# Patient Record
Sex: Female | Born: 1940 | Race: White | Hispanic: No | Smoking: Former smoker
Health system: Southern US, Community
[De-identification: ages and names within clinical notes are randomized; demographics above are authoritative.]

## PROBLEM LIST (undated history)

## (undated) DIAGNOSIS — Z950 Presence of cardiac pacemaker: Secondary | ICD-10-CM

## (undated) DIAGNOSIS — I442 Atrioventricular block, complete: Secondary | ICD-10-CM

## (undated) DIAGNOSIS — E119 Type 2 diabetes mellitus without complications: Secondary | ICD-10-CM

## (undated) DIAGNOSIS — N183 Chronic kidney disease, stage 3 unspecified: Secondary | ICD-10-CM

## (undated) DIAGNOSIS — I219 Acute myocardial infarction, unspecified: Secondary | ICD-10-CM

## (undated) DIAGNOSIS — M1990 Unspecified osteoarthritis, unspecified site: Secondary | ICD-10-CM

## (undated) DIAGNOSIS — E785 Hyperlipidemia, unspecified: Secondary | ICD-10-CM

## (undated) DIAGNOSIS — I509 Heart failure, unspecified: Secondary | ICD-10-CM

## (undated) DIAGNOSIS — F329 Major depressive disorder, single episode, unspecified: Secondary | ICD-10-CM

## (undated) DIAGNOSIS — I10 Essential (primary) hypertension: Secondary | ICD-10-CM

## (undated) DIAGNOSIS — F32A Depression, unspecified: Secondary | ICD-10-CM

## (undated) DIAGNOSIS — Z95810 Presence of automatic (implantable) cardiac defibrillator: Secondary | ICD-10-CM

## (undated) DIAGNOSIS — I428 Other cardiomyopathies: Secondary | ICD-10-CM

## (undated) DIAGNOSIS — I469 Cardiac arrest, cause unspecified: Secondary | ICD-10-CM

## (undated) DIAGNOSIS — R0602 Shortness of breath: Secondary | ICD-10-CM

---

## 1969-07-23 HISTORY — PX: TUBAL LIGATION: SHX77

## 2004-11-22 DIAGNOSIS — I469 Cardiac arrest, cause unspecified: Secondary | ICD-10-CM

## 2004-11-22 DIAGNOSIS — Z950 Presence of cardiac pacemaker: Secondary | ICD-10-CM

## 2004-11-22 DIAGNOSIS — Z95810 Presence of automatic (implantable) cardiac defibrillator: Secondary | ICD-10-CM

## 2004-11-22 DIAGNOSIS — I219 Acute myocardial infarction, unspecified: Secondary | ICD-10-CM

## 2004-11-22 HISTORY — PX: CARDIAC DEFIBRILLATOR PLACEMENT: SHX171

## 2004-11-22 HISTORY — PX: INSERT / REPLACE / REMOVE PACEMAKER: SUR710

## 2004-11-22 HISTORY — DX: Presence of cardiac pacemaker: Z95.0

## 2004-11-22 HISTORY — DX: Acute myocardial infarction, unspecified: I21.9

## 2004-11-22 HISTORY — DX: Presence of automatic (implantable) cardiac defibrillator: Z95.810

## 2004-11-22 HISTORY — DX: Cardiac arrest, cause unspecified: I46.9

## 2012-09-28 ENCOUNTER — Encounter (HOSPITAL_BASED_OUTPATIENT_CLINIC_OR_DEPARTMENT_OTHER): Payer: Self-pay | Admitting: *Deleted

## 2012-09-28 ENCOUNTER — Emergency Department (HOSPITAL_BASED_OUTPATIENT_CLINIC_OR_DEPARTMENT_OTHER): Payer: Medicare Other

## 2012-09-28 ENCOUNTER — Emergency Department (HOSPITAL_BASED_OUTPATIENT_CLINIC_OR_DEPARTMENT_OTHER)
Admission: EM | Admit: 2012-09-28 | Discharge: 2012-09-28 | Disposition: A | Discharge status: Home / Self Care | Payer: Medicare Other | Attending: Emergency Medicine | Admitting: Emergency Medicine

## 2012-09-28 DIAGNOSIS — Z794 Long term (current) use of insulin: Secondary | ICD-10-CM | POA: Insufficient documentation

## 2012-09-28 DIAGNOSIS — J4489 Other specified chronic obstructive pulmonary disease: Secondary | ICD-10-CM | POA: Insufficient documentation

## 2012-09-28 DIAGNOSIS — I509 Heart failure, unspecified: Secondary | ICD-10-CM

## 2012-09-28 DIAGNOSIS — Z79899 Other long term (current) drug therapy: Secondary | ICD-10-CM | POA: Insufficient documentation

## 2012-09-28 DIAGNOSIS — J449 Chronic obstructive pulmonary disease, unspecified: Secondary | ICD-10-CM | POA: Insufficient documentation

## 2012-09-28 DIAGNOSIS — E119 Type 2 diabetes mellitus without complications: Secondary | ICD-10-CM | POA: Insufficient documentation

## 2012-09-28 DIAGNOSIS — I10 Essential (primary) hypertension: Secondary | ICD-10-CM | POA: Insufficient documentation

## 2012-09-28 HISTORY — DX: Essential (primary) hypertension: I10

## 2012-09-28 LAB — COMPREHENSIVE METABOLIC PANEL
ALT: 27 U/L (ref 0–35)
Albumin: 3.1 g/dL — ABNORMAL LOW (ref 3.5–5.2)
Alkaline Phosphatase: 97 U/L (ref 39–117)
Chloride: 104 mEq/L (ref 96–112)
Glucose, Bld: 84 mg/dL (ref 70–99)
Potassium: 3.7 mEq/L (ref 3.5–5.1)
Sodium: 142 mEq/L (ref 135–145)
Total Bilirubin: 0.9 mg/dL (ref 0.3–1.2)
Total Protein: 6.8 g/dL (ref 6.0–8.3)

## 2012-09-28 LAB — URINALYSIS, ROUTINE W REFLEX MICROSCOPIC
Bilirubin Urine: NEGATIVE
Nitrite: NEGATIVE
Specific Gravity, Urine: 1.017 (ref 1.005–1.030)
Urobilinogen, UA: 1 mg/dL (ref 0.0–1.0)
pH: 5 (ref 5.0–8.0)

## 2012-09-28 LAB — CBC WITH DIFFERENTIAL/PLATELET
Basophils Relative: 1 % (ref 0–1)
Eosinophils Absolute: 0.5 10*3/uL (ref 0.0–0.7)
Hemoglobin: 13.1 g/dL (ref 12.0–15.0)
Lymphs Abs: 1.5 10*3/uL (ref 0.7–4.0)
MCH: 27 pg (ref 26.0–34.0)
Neutro Abs: 3.5 10*3/uL (ref 1.7–7.7)
Neutrophils Relative %: 59 % (ref 43–77)
Platelets: 281 10*3/uL (ref 150–400)
RBC: 4.86 MIL/uL (ref 3.87–5.11)

## 2012-09-28 LAB — URINE MICROSCOPIC-ADD ON

## 2012-09-28 NOTE — ED Notes (Signed)
Patient transported to X-ray 

## 2012-09-28 NOTE — ED Provider Notes (Addendum)
History     CSN: [id number]  Arrival date & time 09/28/12  1810   First MD Initiated Contact with Patient 09/28/12 2001      Chief Complaint  Patient presents with  . Weakness    (Consider location/radiation/quality/duration/timing/severity/associated sxs/prior treatment) HPI Comments: Is a 71 year old woman who moved to [place] in July to be near her daughter. She has a number of illnesses that she treats, including diabetes, hypertension, high blood pressure, and has a pacemaker. 4 weeks ago she had an episode where she became very nauseated nearly fainted. She was taken [facility] at that time and was felt to have had food poisoning. Since then she hasn't really recovered and seems very weak. She can walk out of the mailbox and back. Although, oral warm recent eye surgery, with laser surgery for diabetic retinopathy. She is scheduled for cataract surgery in a few weeks.  Patient is a 71 y.o. female presenting with weakness. The history is provided by the patient and a relative. No language interpreter was used.  Weakness  Additional symptoms include weakness.    Past Medical History  Diagnosis Date  . COPD (chronic obstructive pulmonary disease)   . Diabetes mellitus without complication   . Hypertension     Past Surgical History  Procedure Date  . Pacemaker insertion     No family history on file.  History  Substance Use Topics  . Smoking status: Never Smoker   . Smokeless tobacco: Not on file  . Alcohol Use: No    OB History    Grav Para Term Preterm Abortions TAB SAB Ect Mult Living                  Review of Systems  Neurological: Positive for weakness.    Allergies  Codeine  Home Medications   Current Outpatient Rx  Name  Route  Sig  Dispense  Refill  . AMITRIPTYLINE HCL 10 MG PO TABS   Oral   Take 10 mg by mouth at bedtime.         . ATORVASTATIN CALCIUM 20 MG PO TABS   Oral   Take 20 mg by mouth daily.           . FUROSEMIDE 40 MG PO TABS   Oral   Take 40 mg by mouth daily.         . INSULIN GLARGINE 100 UNIT/ML [place] SOLN   Subcutaneous   Inject 30 Units into the skin 2 (two) times daily.         Marland Kitchen METOPROLOL TARTRATE 25 MG PO TABS   Oral   Take by mouth 2 (two) times daily.           BP 167/82  Pulse 79  Temp 97.4 F (36.3 C) (Oral)  Resp 24  SpO2 97%  Physical Exam  Constitutional: She is oriented to person, place, and time. She appears well-developed and well-nourished. No distress.  HENT:  Head: Normocephalic and atraumatic.  Right Ear: External ear normal.  Left Ear: External ear normal.  Mouth/Throat: Oropharynx is clear and moist.  Eyes: Conjunctivae normal and EOM are normal. Pupils are equal, round, and reactive to light.  Neck: Normal range of motion. Neck supple.  Cardiovascular: Normal rate, regular rhythm and normal heart sounds.   Pulmonary/Chest: Effort normal and breath sounds normal.  Abdominal: Soft. Bowel sounds are normal.  Musculoskeletal:       Trace pedal edema bilaterally.  Neurological: She is  alert and oriented to person, place, and time.       No sensory or motor deficit.  Skin: Skin is warm and dry.    ED Course  Procedures (including critical care time)   Labs Reviewed  URINALYSIS, ROUTINE W REFLEX MICROSCOPIC  CBC WITH DIFFERENTIAL  COMPREHENSIVE METABOLIC PANEL  URINALYSIS, ROUTINE W REFLEX MICROSCOPIC  PRO B NATRIURETIC PEPTIDE    Date: 09/28/2012  Rate: 75  Rhythm:   Electronically paced rhythm  QRS Axis: left  Intervals: normal  ST/T Wave abnormalities: Inverted T waves in I, AVL  Conduction Disutrbances:left bundle branch block from pacing   Narrative Interpretation: Abnormal EKG  Old EKG Reviewed: none available  10:46 PM Results for orders placed during the hospital encounter of 09/28/12  URINALYSIS, ROUTINE W REFLEX MICROSCOPIC      Component Value Range   Color, Urine YELLOW  YELLOW   APPearance CLOUDY (*) CLEAR    Specific Gravity, Urine 1.017  1.005 - 1.030   pH 5.0  5.0 - 8.0   Glucose, UA NEGATIVE  NEGATIVE mg/dL   Hgb urine dipstick MODERATE (*) NEGATIVE   Bilirubin Urine NEGATIVE  NEGATIVE   Ketones, ur NEGATIVE  NEGATIVE mg/dL   Protein, ur >742 (*) NEGATIVE mg/dL   Urobilinogen, UA 1.0  0.0 - 1.0 mg/dL   Nitrite NEGATIVE  NEGATIVE   Leukocytes, UA SMALL (*) NEGATIVE  CBC WITH DIFFERENTIAL      Component Value Range   WBC 5.9  4.0 - 10.5 K/uL   RBC 4.86  3.87 - 5.11 MIL/uL   Hemoglobin 13.1  12.0 - 15.0 g/dL   HCT 59.5  63.8 - 75.6 %   MCV 81.1  78.0 - 100.0 fL   MCH 27.0  26.0 - 34.0 pg   MCHC 33.2  30.0 - 36.0 g/dL   RDW 43.3  29.5 - 18.8 %   Platelets 281  150 - 400 K/uL   Neutrophils Relative 59  43 - 77 %   Neutro Abs 3.5  1.7 - 7.7 K/uL   Lymphocytes Relative 25  12 - 46 %   Lymphs Abs 1.5  0.7 - 4.0 K/uL   Monocytes Relative 7  3 - 12 %   Monocytes Absolute 0.4  0.1 - 1.0 K/uL   Eosinophils Relative 8 (*) 0 - 5 %   Eosinophils Absolute 0.5  0.0 - 0.7 K/uL   Basophils Relative 1  0 - 1 %   Basophils Absolute 0.0  0.0 - 0.1 K/uL  COMPREHENSIVE METABOLIC PANEL      Component Value Range   Sodium 142  135 - 145 mEq/L   Potassium 3.7  3.5 - 5.1 mEq/L   Chloride 104  96 - 112 mEq/L   CO2 27  19 - 32 mEq/L   Glucose, Bld 84  70 - 99 mg/dL   BUN 28 (*) 6 - 23 mg/dL   Creatinine, Ser 4.16 (*) 0.50 - 1.10 mg/dL   Calcium 9.1  8.4 - 60.6 mg/dL   Total Protein 6.8  6.0 - 8.3 g/dL   Albumin 3.1 (*) 3.5 - 5.2 g/dL   AST 26  0 - 37 U/L   ALT 27  0 - 35 U/L   Alkaline Phosphatase 97  39 - 117 U/L   Total Bilirubin 0.9  0.3 - 1.2 mg/dL   GFR calc non Af Amer 40 (*) >90 mL/min   GFR calc Af Amer 46 (*) >90 mL/min  PRO B NATRIURETIC  PEPTIDE      Component Value Range   Pro B Natriuretic peptide (BNP) 6806.0 (*) 0 - 125 pg/mL  URINE MICROSCOPIC-ADD ON      Component Value Range   Squamous Epithelial / LPF FEW (*) RARE   WBC, UA 7-10  <3 WBC/hpf   RBC / HPF 7-10  <3 RBC/hpf    Bacteria, UA MANY (*) RARE   Dg Chest 2 View  09/28/2012  *RADIOLOGY REPORT*  Clinical Data: Generalized weakness.  Prior history of CHF. Current history of hypertension and diabetes.  CHEST - 2 VIEW  Comparison: None.  Findings: Cardiac silhouette mildly enlarged.  Left subclavian dual lead transvenous pacemaker with dual lead tips at the expected location of the right atrial appendage and right ventricular apex. Thoracic aorta atherosclerotic.  Hilar and mediastinal contours otherwise unremarkable.  Mild diffuse interstitial pulmonary edema. Small bilateral pleural effusions.  Mild degenerative changes involving the thoracic spine.  Old healed fracture involving the right humeral neck.  IMPRESSION: Mild CHF, with mild cardiomegaly and mild diffuse interstitial pulmonary edema.  Small bilateral pleural effusions.   Original Report Authenticated By: Hulan Saas, M.D.      Pt has mild CHF.  She has no local cardiologist.  Referred to Dr. Royann Shivers, cardiologist on call today.  She should continue her regular medications.   1. Congestive heart failure        Carleene Cooper III, MD 09/28/12 1610     Carleene Cooper III, MD 10/11/12 [phone]

## 2012-09-28 NOTE — ED Notes (Signed)
Weakness. States she was treated for food poisoning at [facility] ED 2 weeks ago. She states she also had a UTI. States she has not been the same since. She is very talkative. Multiple complaints and poor historian.

## 2012-09-28 NOTE — ED Notes (Signed)
Pt. Talks constantly and is uncooperative at times.  Pt. Very mouthy and will not go by her PCP recommendations.  Pt. Daughter is under impression she can get a new Dr. Cletis [place] the ED.  Pt. Reports she does not agree with what her PCP wants her to do.

## 2012-09-30 LAB — URINE CULTURE

## 2012-10-01 ENCOUNTER — Emergency Department (HOSPITAL_COMMUNITY): Payer: Medicare Other

## 2012-10-01 ENCOUNTER — Inpatient Hospital Stay (HOSPITAL_COMMUNITY)
Admission: EM | Admit: 2012-10-01 | Discharge: 2012-10-04 | DRG: 292 | Disposition: A | Discharge status: Home Health | Payer: Medicare Other | Attending: Internal Medicine | Admitting: Internal Medicine

## 2012-10-01 ENCOUNTER — Encounter (HOSPITAL_COMMUNITY): Payer: Self-pay | Admitting: Emergency Medicine

## 2012-10-01 DIAGNOSIS — Z95810 Presence of automatic (implantable) cardiac defibrillator: Secondary | ICD-10-CM

## 2012-10-01 DIAGNOSIS — I5023 Acute on chronic systolic (congestive) heart failure: Principal | ICD-10-CM | POA: Diagnosis present

## 2012-10-01 DIAGNOSIS — B9689 Other specified bacterial agents as the cause of diseases classified elsewhere: Secondary | ICD-10-CM | POA: Diagnosis present

## 2012-10-01 DIAGNOSIS — I129 Hypertensive chronic kidney disease with stage 1 through stage 4 chronic kidney disease, or unspecified chronic kidney disease: Secondary | ICD-10-CM | POA: Diagnosis present

## 2012-10-01 DIAGNOSIS — N179 Acute kidney failure, unspecified: Secondary | ICD-10-CM | POA: Diagnosis present

## 2012-10-01 DIAGNOSIS — Z885 Allergy status to narcotic agent status: Secondary | ICD-10-CM

## 2012-10-01 DIAGNOSIS — I509 Heart failure, unspecified: Secondary | ICD-10-CM | POA: Diagnosis present

## 2012-10-01 DIAGNOSIS — J4489 Other specified chronic obstructive pulmonary disease: Secondary | ICD-10-CM | POA: Diagnosis present

## 2012-10-01 DIAGNOSIS — I442 Atrioventricular block, complete: Secondary | ICD-10-CM | POA: Diagnosis present

## 2012-10-01 DIAGNOSIS — Z79899 Other long term (current) drug therapy: Secondary | ICD-10-CM

## 2012-10-01 DIAGNOSIS — E119 Type 2 diabetes mellitus without complications: Secondary | ICD-10-CM | POA: Diagnosis present

## 2012-10-01 DIAGNOSIS — E876 Hypokalemia: Secondary | ICD-10-CM | POA: Diagnosis present

## 2012-10-01 DIAGNOSIS — I10 Essential (primary) hypertension: Secondary | ICD-10-CM | POA: Diagnosis present

## 2012-10-01 DIAGNOSIS — N289 Disorder of kidney and ureter, unspecified: Secondary | ICD-10-CM | POA: Diagnosis not present

## 2012-10-01 DIAGNOSIS — I428 Other cardiomyopathies: Secondary | ICD-10-CM | POA: Diagnosis present

## 2012-10-01 DIAGNOSIS — I429 Cardiomyopathy, unspecified: Secondary | ICD-10-CM | POA: Diagnosis present

## 2012-10-01 DIAGNOSIS — N390 Urinary tract infection, site not specified: Secondary | ICD-10-CM | POA: Diagnosis present

## 2012-10-01 DIAGNOSIS — Z8674 Personal history of sudden cardiac arrest: Secondary | ICD-10-CM

## 2012-10-01 DIAGNOSIS — N183 Chronic kidney disease, stage 3 unspecified: Secondary | ICD-10-CM | POA: Diagnosis present

## 2012-10-01 DIAGNOSIS — E785 Hyperlipidemia, unspecified: Secondary | ICD-10-CM | POA: Diagnosis present

## 2012-10-01 DIAGNOSIS — Z794 Long term (current) use of insulin: Secondary | ICD-10-CM

## 2012-10-01 DIAGNOSIS — J449 Chronic obstructive pulmonary disease, unspecified: Secondary | ICD-10-CM | POA: Diagnosis present

## 2012-10-01 DIAGNOSIS — Z87891 Personal history of nicotine dependence: Secondary | ICD-10-CM

## 2012-10-01 HISTORY — DX: Cardiac arrest, cause unspecified: I46.9

## 2012-10-01 HISTORY — DX: Hyperlipidemia, unspecified: E78.5

## 2012-10-01 HISTORY — DX: Chronic kidney disease, stage 3 unspecified: N18.30

## 2012-10-01 HISTORY — DX: Heart failure, unspecified: I50.9

## 2012-10-01 HISTORY — DX: Chronic kidney disease, stage 3 (moderate): N18.3

## 2012-10-01 LAB — CBC
HCT: 41.4 % (ref 36.0–46.0)
Hemoglobin: 13.9 g/dL (ref 12.0–15.0)
MCH: 27.5 pg (ref 26.0–34.0)
MCHC: 33.6 g/dL (ref 30.0–36.0)
MCV: 82 fL (ref 78.0–100.0)
MCV: 82.3 fL (ref 78.0–100.0)
Platelets: 264 10*3/uL (ref 150–400)
RBC: 5.21 MIL/uL — ABNORMAL HIGH (ref 3.87–5.11)
WBC: 6.6 10*3/uL (ref 4.0–10.5)

## 2012-10-01 LAB — HEMOGLOBIN A1C
Hgb A1c MFr Bld: 9.2 % — ABNORMAL HIGH (ref <5.7)
Mean Plasma Glucose: 217 mg/dL — ABNORMAL HIGH (ref <117)

## 2012-10-01 LAB — CREATININE, SERUM
Creatinine, Ser: 1.16 mg/dL — ABNORMAL HIGH (ref 0.50–1.10)
GFR calc Af Amer: 53 mL/min — ABNORMAL LOW (ref >90)

## 2012-10-01 LAB — LIPID PANEL
HDL: 31 mg/dL — ABNORMAL LOW (ref >39)
LDL Cholesterol: 70 mg/dL (ref 0–99)
Total CHOL/HDL Ratio: 3.7 RATIO

## 2012-10-01 LAB — BASIC METABOLIC PANEL
BUN: 30 mg/dL — ABNORMAL HIGH (ref 6–23)
Calcium: 8.8 mg/dL (ref 8.4–10.5)
Creatinine, Ser: 1.11 mg/dL — ABNORMAL HIGH (ref 0.50–1.10)
GFR calc non Af Amer: 48 mL/min — ABNORMAL LOW (ref >90)
Glucose, Bld: 218 mg/dL — ABNORMAL HIGH (ref 70–99)
Potassium: 3.7 mEq/L (ref 3.5–5.1)

## 2012-10-01 LAB — TROPONIN I: Troponin I: <0.3 ng/mL (ref <0.30)

## 2012-10-01 LAB — CK TOTAL AND CKMB (NOT AT ARMC): Relative Index: 4.4 — ABNORMAL HIGH (ref 0.0–2.5)

## 2012-10-01 MED ORDER — ATORVASTATIN CALCIUM 20 MG PO TABS
20.0000 mg | ORAL_TABLET | Freq: Every day | ORAL | Status: DC
  Administered 2012-10-01 – 2012-10-03 (×3): 20 mg via ORAL
  Filled 2012-10-01 (×4): qty 1

## 2012-10-01 MED ORDER — SODIUM CHLORIDE 0.9 % IJ SOLN
3.0000 mL | Freq: Two times a day (BID) | INTRAMUSCULAR | Status: DC
  Administered 2012-10-01 – 2012-10-03 (×4): 3 mL via INTRAVENOUS

## 2012-10-01 MED ORDER — INSULIN GLARGINE 100 UNIT/ML ~~LOC~~ SOLN
30.0000 [IU] | Freq: Two times a day (BID) | SUBCUTANEOUS | Status: DC
  Administered 2012-10-01 – 2012-10-04 (×6): 30 [IU] via SUBCUTANEOUS

## 2012-10-01 MED ORDER — ACETAMINOPHEN 325 MG PO TABS: 650.0000 mg | ORAL_TABLET | ORAL | Status: DC | PRN

## 2012-10-01 MED ORDER — SODIUM CHLORIDE 0.9 % IJ SOLN: 3.0000 mL | INTRAMUSCULAR | Status: DC | PRN

## 2012-10-01 MED ORDER — SODIUM CHLORIDE 0.9 % IV SOLN: 250.0000 mL | INTRAVENOUS | Status: DC | PRN

## 2012-10-01 MED ORDER — AMITRIPTYLINE HCL 10 MG PO TABS
10.0000 mg | ORAL_TABLET | Freq: Every day | ORAL | Status: DC
  Administered 2012-10-01 – 2012-10-03 (×3): 10 mg via ORAL
  Filled 2012-10-01 (×4): qty 1

## 2012-10-01 MED ORDER — DEXTROSE 5 % IV SOLN
1.0000 g | Freq: Once | INTRAVENOUS | Status: AC
  Administered 2012-10-01: 1 g via INTRAVENOUS
  Filled 2012-10-01: qty 10

## 2012-10-01 MED ORDER — ASPIRIN EC 81 MG PO TBEC
81.0000 mg | DELAYED_RELEASE_TABLET | Freq: Every day | ORAL | Status: DC
  Administered 2012-10-01 – 2012-10-04 (×4): 81 mg via ORAL
  Filled 2012-10-01 (×4): qty 1

## 2012-10-01 MED ORDER — ONDANSETRON HCL 4 MG/2ML IJ SOLN
4.0000 mg | Freq: Four times a day (QID) | INTRAMUSCULAR | Status: DC | PRN
  Administered 2012-10-03: 4 mg via INTRAVENOUS
  Filled 2012-10-01: qty 2

## 2012-10-01 MED ORDER — FUROSEMIDE 40 MG PO TABS
40.0000 mg | ORAL_TABLET | Freq: Every day | ORAL | Status: DC
  Filled 2012-10-01: qty 1

## 2012-10-01 MED ORDER — FUROSEMIDE 10 MG/ML IJ SOLN
80.0000 mg | Freq: Once | INTRAMUSCULAR | Status: AC
  Administered 2012-10-01: 80 mg via INTRAVENOUS
  Filled 2012-10-01: qty 8

## 2012-10-01 MED ORDER — INSULIN ASPART 100 UNIT/ML ~~LOC~~ SOLN
0.0000 [IU] | Freq: Three times a day (TID) | SUBCUTANEOUS | Status: DC
  Administered 2012-10-02 – 2012-10-03 (×2): 2 [IU] via SUBCUTANEOUS

## 2012-10-01 MED ORDER — ENOXAPARIN SODIUM 40 MG/0.4ML ~~LOC~~ SOLN
40.0000 mg | SUBCUTANEOUS | Status: DC
  Administered 2012-10-01 – 2012-10-03 (×3): 40 mg via SUBCUTANEOUS
  Filled 2012-10-01 (×4): qty 0.4

## 2012-10-01 MED ORDER — INSULIN ASPART 100 UNIT/ML ~~LOC~~ SOLN
0.0000 [IU] | Freq: Every day | SUBCUTANEOUS | Status: DC
  Administered 2012-10-01: 2 [IU] via SUBCUTANEOUS

## 2012-10-01 MED ORDER — METOPROLOL TARTRATE 12.5 MG HALF TABLET
12.5000 mg | ORAL_TABLET | Freq: Two times a day (BID) | ORAL | Status: DC
  Administered 2012-10-01 – 2012-10-04 (×6): 12.5 mg via ORAL
  Filled 2012-10-01 (×7): qty 1

## 2012-10-01 MED ORDER — LISINOPRIL 10 MG PO TABS
10.0000 mg | ORAL_TABLET | Freq: Every day | ORAL | Status: DC
  Administered 2012-10-01 – 2012-10-03 (×3): 10 mg via ORAL
  Filled 2012-10-01 (×4): qty 1

## 2012-10-01 NOTE — ED Notes (Signed)
Pt c.o weakness and shortness of breath x 2 days. Pt seen at PMD the other day. Pt reports symptoms similar to when she has fluid build up.

## 2012-10-01 NOTE — ED Notes (Signed)
+   Urine Chart sent to EDP office for review. 

## 2012-10-01 NOTE — ED Provider Notes (Signed)
History     CSN: [id number]  Arrival date & time 10/01/12  1000   First MD Initiated Contact with Patient 10/01/12 1011      Chief Complaint  Patient presents with  . Weakness  . Shortness of Breath    The history is provided by the patient.  worsening SOB over the past 5-7 days. No CP. Exertional SOB. Compliant with meds including lasix. Hx of CHF. From [place], still taking all of her medications. Hx of pacer. Denies unilateral leg swelling. Seen at outside ER several days ago, outpatient cards appt made for 4 days from now. No change in meds at that time in the ER. Symptoms worse with walking and lying flat. Symptoms moderate in severity. No fever or chills or cough  Past Medical History  Diagnosis Date  . COPD (chronic obstructive pulmonary disease)   . Diabetes mellitus without complication   . Hypertension   . UTI (lower urinary tract infection)   . CHF (congestive heart failure)     Past Surgical History  Procedure Date  . Pacemaker insertion     No family history on file.  History  Substance Use Topics  . Smoking status: Never Smoker   . Smokeless tobacco: Not on file  . Alcohol Use: No    OB History    Grav Para Term Preterm Abortions TAB SAB Ect Mult Living                  Review of Systems  All other systems reviewed and are negative.    Allergies  Codeine  Home Medications   Current Outpatient Rx  Name  Route  Sig  Dispense  Refill  . AMITRIPTYLINE HCL 10 MG PO TABS   Oral   Take 10 mg by mouth at bedtime.         . ATORVASTATIN CALCIUM 20 MG PO TABS   Oral   Take 20 mg by mouth daily.         . FUROSEMIDE 40 MG PO TABS   Oral   Take 40 mg by mouth daily.         . INSULIN GLARGINE 100 UNIT/ML [place] SOLN   Subcutaneous   Inject 30 Units into the skin 2 (two) times daily.         Marland Kitchen METOPROLOL TARTRATE 25 MG PO TABS   Oral   Take 12.5 mg by mouth 2 (two) times daily.            BP 169/105  Pulse 93  Temp 98.3 F (36.8  C) (Oral)  Resp 22  Physical Exam  Nursing note and vitals reviewed. Constitutional: She is oriented to person, place, and time. She appears well-developed and well-nourished. No distress.  HENT:  Head: Normocephalic and atraumatic.  Eyes: EOM are normal.  Neck: Normal range of motion.  Cardiovascular: Normal rate, regular rhythm and normal heart sounds.   Pulmonary/Chest: Effort normal and breath sounds normal.       Rales in bases  Abdominal: Soft. She exhibits no distension. There is no tenderness.  Musculoskeletal: Normal range of motion.  Neurological: She is alert and oriented to person, place, and time.  Skin: Skin is warm and dry.  Psychiatric: She has a normal mood and affect. Judgment normal.    ED Course  Procedures (including critical care time)  Labs Reviewed  BASIC METABOLIC PANEL - Abnormal; Notable for the following:    Glucose, Bld 218 (*)  BUN 30 (*)     Creatinine, Ser 1.11 (*)     GFR calc non Af Amer 48 (*)     GFR calc Af Amer 56 (*)     All other components within normal limits  PRO B NATRIURETIC PEPTIDE - Abnormal; Notable for the following:    Pro B Natriuretic peptide (BNP) 6579.0 (*)     All other components within normal limits  CBC  TROPONIN I   Dg Chest 2 View  10/01/2012  *RADIOLOGY REPORT*  Clinical Data: Weakness, shortness of breath  CHEST - 2 VIEW  Comparison: 09/28/2012  Findings: Cardiomegaly again noted.  Dual lead cardiac pacemaker is unchanged in position.  Mild perihilar and infrahilar interstitial prominence.  Mild interstitial edema cannot be excluded.  Small bilateral pleural effusion with basilar atelectasis.  IMPRESSION: Dual lead cardiac pacemaker is unchanged in position.  Mild perihilar and infrahilar interstitial prominence.  Mild interstitial edema cannot be excluded.  Small bilateral pleural effusion with basilar atelectasis.   Original Report Authenticated By: Natasha Mead, M.D.    I personally reviewed the imaging tests  through PACS system I reviewed available ER/hospitalization records through the EMR   1. CHF (congestive heart failure)     Date: 10/01/2012  Rate: 94  Rhythm: normal sinus rhythm  QRS Axis: normal  Intervals: LBBB  ST/T Wave abnormalities: normal  Conduction Disutrbances: none  Narrative Interpretation:   Old EKG Reviewed: No significant changes noted      MDM  Will admit for CHF exacerbation. OPC to admit. No CP.         Lyanne Co, MD 10/01/12 [phone]

## 2012-10-01 NOTE — ED Notes (Signed)
PT absent from room at this time . Family reported PT went to X-RAY.

## 2012-10-01 NOTE — H&P (Signed)
Hospital Admission Note Date: 10/01/2012  Patient name: [name] [name] Medical record number: [id number] Date of birth: 05-Jan-1941 Age: 71 y.o. Gender: female PCP: [name] [name]  Medical Service: [facility]  Attending physician:  [name] [name]  1st Contact: [name] [name]   Pager: [phone] 2nd Contact: [name] [name]   Pager: [phone] After 5 pm or weekends: 1st Contact:  Intern on call   Pager: [phone] 2nd Contact:  Resident on call  Pager: [phone]  Chief Complaint: Worsening shortness of breath  History of Present Illness: [name] [name] is a 71 year old female with past medical history of heart failure with unknown baseline ejection fraction, cardiac arrest s/p dual chamber pacemaker (2006), hypertension, type 2 diabetes, and hyperlipidemia presenting with subacute worsening of shortness of breath. Patient reports that over the past month she is noticed increasing shortness of breath with exertion, fatigue, and worsening of her bilateral lower extremity edema. She says that these symptoms have become more pronounced over the past 5-7 days.  She cannot walk to the mailbox and back without becoming short of breath. She also notices increasing shortness of breath when she has to carry on conversations. She has mild SOB at rest. She sleeps on 2 pillows at night and awoke last night with orthopnea. She reports compliance with home lasix dose of 40mg  daily. She denies chest pain/pressure or palpitations. She says she has a chronic cough over the past month which is sometimes productive of green/yellow sputum. On 11/7, she presented to the [facility] ED with similar complaints. She was scheduled follow-up with [name] [name] (cardiology) for 10/05/12 and discharged from the ED. She says that her symptoms have not improved which is why she is presenting today.  She denies fever, chills, weight change, HA, acute visual changes, abdominal pain, nausea, vomiting, diarrhea, constipation, hematochezia,  melena, dysuria, urinary frequency, change in color of urine, dizziness, weakness. She has chronic "burning" of bilateral feet for which she takes amitriptyline.   Meds: Current Outpatient Rx  Name  Route  Sig  Dispense  Refill  . AMITRIPTYLINE HCL 10 MG PO TABS   Oral   Take 10 mg by mouth at bedtime.         . ATORVASTATIN CALCIUM 20 MG PO TABS   Oral   Take 20 mg by mouth daily.         . FUROSEMIDE 40 MG PO TABS   Oral   Take 40 mg by mouth daily.         . INSULIN GLARGINE 100 UNIT/ML [place] SOLN   Subcutaneous   Inject 30 Units into the skin 2 (two) times daily.         Marland Kitchen METOPROLOL TARTRATE 25 MG PO TABS   Oral   Take 12.5 mg by mouth 2 (two) times daily.            Allergies: Allergies as of 10/01/2012 - Review Complete 10/01/2012  Allergen Reaction Noted  . Codeine Nausea Only 09/28/2012   Past Medical History  Diagnosis Date  . Diabetes mellitus   . Hypertension   . CHF (congestive heart failure)   . Cardiac arrest     2006, etiology unknown, s/p dual chamber pacemaker for secondary prevention  . Hyperlipidemia   . Chronic renal insufficiency, stage III (moderate)     Etiology unknown   Past Surgical History  Procedure Date  . Pacemaker insertion     Dual chamber pacemaker, 2006, for secondary prevention cardiac arrest  . Tubal ligation  Family History  Problem Relation Age of Onset  . Heart disease Brother   . Diabetes Mother   . Diabetes Father    History   Social History  . Marital Status: Widowed    Spouse Name: N/A    Number of Children: N/A  . Years of Education: N/A   Occupational History  . Not on file.   Social History Main Topics  . Smoking status: Former Smoker -- 1.0 packs/day for 3 years    Types: Cigarettes  . Smokeless tobacco: Not on file  . Alcohol Use: No     Comment: No current, former heavy use, years since last drink  . Drug Use: No  . Sexually Active: Not Currently   Other Topics Concern  . Not on file    Social History Narrative   Pt is widowed x2. 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 Moved here in July 2013 to be closer to daughter. One son died of MI earlier 2012-05-09 and another was murdered later this summer. 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 3 pack year smoking history. Lives in [place] on her own, near daughter.     Review of Systems: 10 pt ROS performed, pertinent positives and negatives noted in HPI  Physical Exam: Blood pressure 144/112, pulse 90, temperature 98.3 F (36.8 C), temperature source Oral, resp. rate 22, SpO2 100.00%. Vitals reviewed. General: resting in bed, breathing slightly labored, NAD HEENT: PERRL, EOMI, MMM of OP, absent dentition, caries Cardiac: RRR, no rubs, murmurs or gallops Pulm: fine crackles in bilateral bases, good air movement, no wheezing or rhonchi, no accessory muscle use Abd: soft, nontender, nondistended, BS present Ext: 2+ pitting edema up to knees bilaterally, non-blanchable reddish-brown pigmented lesions over dorsal aspect bilateral feet; warm and well perfused, 2+ DP pulses Neuro: alert and oriented X3, cranial nerves II-XII grossly intact, strength and sensation to light touch equal in bilateral upper and lower extremities   Lab results: Basic Metabolic Panel:  Basename 10/01/12 1041 09/28/12 05/09/02  NA 141 142  K 3.7 3.7  CL 105 104  CO2 26 27  GLUCOSE 218* 84  BUN 30* 28*  CREATININE 1.11* 1.30*  CALCIUM 8.8 9.1  MG -- --  PHOS -- --   Liver Function Tests:  [employer] [facility] 09/28/12 May 09, 2102  AST 26  ALT 27  ALKPHOS 97  BILITOT 0.9  PROT 6.8  ALBUMIN 3.1*   CBC:  Basename 10/01/12 1041 09/28/12 2103  WBC 7.1 5.9  NEUTROABS -- 3.5  HGB 13.9 13.1  HCT 41.4 39.4  MCV 82.0 81.1  PLT 256 281   Cardiac Enzymes:  Basename 10/01/12 1351 10/01/12 1042  CKTOTAL 117 --  CKMB 5.2* --  CKMBINDEX -- --  TROPONINI -- <0.30   BNP:  Basename 10/01/12 1042 09/28/12 05-09-2102  PROBNP 6579.0* 6806.0*    Urinalysis:  Basename 09/28/12 05/09/41  COLORURINE YELLOW  LABSPEC 1.017  PHURINE 5.0  GLUCOSEU NEGATIVE  HGBUR MODERATE*  BILIRUBINUR NEGATIVE  KETONESUR NEGATIVE  PROTEINUR >300*  UROBILINOGEN 1.0  NITRITE NEGATIVE  LEUKOCYTESUR SMALL*   Imaging results:  Dg Chest 2 View  10/01/2012  *RADIOLOGY REPORT*  Clinical Data: Weakness, shortness of breath  CHEST - 2 VIEW  Comparison: 09/28/2012  Findings: Cardiomegaly again noted.  Dual lead cardiac pacemaker is unchanged in position.  Mild perihilar and infrahilar interstitial prominence.  Mild interstitial edema cannot be excluded.  Small bilateral pleural effusion with basilar atelectasis.  IMPRESSION: Dual lead cardiac pacemaker is unchanged  in position.  Mild perihilar and infrahilar interstitial prominence.  Mild interstitial edema cannot be excluded.  Small bilateral pleural effusion with basilar atelectasis.   Original Report Authenticated By: Natasha Mead, M.D.     Other results: EKG: appears paced (atrial sense,ventricular paced) but pacing hatch marks are subtle an some buried in baseline. Prolonged QT. No new changes compared to 09/28/12.  Assessment & Plan by Problem: Ms. Quaid is a 71 yo female w pmh of CHF, COPD, T2DM and HTN presenting with subacute worsening of SOB and LE edema concerning for exacerbation of CHF.  1) Acute on chronic CHF exacerbation  Pt presents with gradual worsening of SOB, LE edema in setting of heart failure. Patient cannot remember many details about diagnosis, but says she takes 40mg  furosemide daily. Does not know baseline EF. CXR demonstrates mild interstitial edema and small b/l pleural effusions. Her pro-BNP is 6579, slightly less than the value 3 days ago (6806) when she presented to the ED w similar symptoms. Reassuringly, no acute EKG changes, troponin negative x1.  She received 80mg  IV lasix in the ED. Has appt w [name] [name] for this Thursday.  -  Will restart home lasix tomorrow, evaluate need  for continued IV lasix - echo - daily wts, I/Os - cycle CEs  2)  H/o cardiac arrest s/p dual chamber pacemaker  She has history of cardiac arrest in 2006 etiology unknown s/p dual chamber pacemaker. No chest pain or palpitations, has not noticed any strange rhythms of her heart. By EKG it looks like she continues to be atrial sensing, ventricular pacing. - repeat EKG tomorrow am, - cards follow up on Thursday as above  3) HTN Patient's BP is elevated, systolics 140s-160s diastolics 100s-110s. Takes metoprolol 12.5 BID at home, not on ACE inhibitor. - Continue metoprolol 12.5 BID, start lisinopril 10 daily  4) T2DM On lantus 30U BID at home. Takes blood glucose erratically, cannot remember values at home. Glucose on Bmet 218. - Will check HbA1c - SSI while inpt  5) Likely chronic renal failure, stage 3 Pt's GFR is 48. This is improved from her visit 3 days ago, when GFR was 40. She says she has been told that she needs to be "careful"  with her kidneys, but does not know of disease or diagnosis. Could be related to diabetic disease.  6) Possible obstructive pulmonary disease Pt reports dx COPD but no PFTs in the past, not currently on inhaler therapy, no lung hyperinflation/emphesematous changes on radiography, and no wheezing. She has only smoked for a few years.  - She may need PFTs as an outpatient at some point if she develops sx of COPD   Signed: Bronson Curb 10/01/2012, 2:45 PM

## 2012-10-01 NOTE — ED Notes (Signed)
Move to CDU 11

## 2012-10-02 DIAGNOSIS — E119 Type 2 diabetes mellitus without complications: Secondary | ICD-10-CM

## 2012-10-02 DIAGNOSIS — I509 Heart failure, unspecified: Secondary | ICD-10-CM

## 2012-10-02 DIAGNOSIS — I10 Essential (primary) hypertension: Secondary | ICD-10-CM

## 2012-10-02 LAB — URINALYSIS, ROUTINE W REFLEX MICROSCOPIC
Leukocytes, UA: NEGATIVE
Nitrite: NEGATIVE
Specific Gravity, Urine: 1.006 (ref 1.005–1.030)
pH: 7 (ref 5.0–8.0)

## 2012-10-02 LAB — BASIC METABOLIC PANEL
CO2: 26 mEq/L (ref 19–32)
Calcium: 8.5 mg/dL (ref 8.4–10.5)
Sodium: 137 mEq/L (ref 135–145)

## 2012-10-02 LAB — URINE MICROSCOPIC-ADD ON

## 2012-10-02 LAB — GLUCOSE, CAPILLARY
Glucose-Capillary: 63 mg/dL — ABNORMAL LOW (ref 70–99)
Glucose-Capillary: 107 mg/dL — ABNORMAL HIGH (ref 70–99)

## 2012-10-02 MED ORDER — SULFAMETHOXAZOLE-TMP DS 800-160 MG PO TABS
1.0000 | ORAL_TABLET | Freq: Two times a day (BID) | ORAL | Status: DC
  Administered 2012-10-02 – 2012-10-03 (×4): 1 via ORAL
  Filled 2012-10-02 (×7): qty 1

## 2012-10-02 MED ORDER — POTASSIUM CHLORIDE CRYS ER 20 MEQ PO TBCR
40.0000 meq | EXTENDED_RELEASE_TABLET | Freq: Two times a day (BID) | ORAL | Status: AC
  Administered 2012-10-02 (×2): 40 meq via ORAL
  Filled 2012-10-02 (×2): qty 2

## 2012-10-02 MED ORDER — FUROSEMIDE 10 MG/ML IJ SOLN: 40.0000 mg | Freq: Once | INTRAMUSCULAR | Status: DC

## 2012-10-02 MED ORDER — FUROSEMIDE 10 MG/ML IJ SOLN
80.0000 mg | Freq: Once | INTRAMUSCULAR | Status: AC
  Administered 2012-10-02: 80 mg via INTRAVENOUS

## 2012-10-02 NOTE — Progress Notes (Signed)
Subjective: Pt says LE swelling is worsened this morning. No change in shortness of breath. All Is/Os not recorded last night, but pt wt down 2lbs s/p 80mg  IV lasix in ED. Concerned because she wants to make sure she is out of the hospital to make it to her appointment on Thursday with Dr. Keene Breath.  Denies CP, N/V, dizziness, abdominal pain, diarrhea.  Objective: Vital signs in last 24 hours: Filed Vitals:   10/01/12 1834 10/01/12 2100 10/02/12 0409 10/02/12 1028  BP: 171/91 153/82 117/58 120/60  Pulse: 89 84 76 89  Temp: 97.6 F (36.4 C) 98.3 F (36.8 C) 98.1 F (36.7 C)   TempSrc: Oral Oral Oral   Resp: 22 22 18    Height: 5\' 3"  (1.6 m)     Weight: 153 lb 11.2 oz (69.718 kg)  151 lb 12.8 oz (68.856 kg)   SpO2: 96% 98% 97%    Weight change:   Intake/Output Summary (Last 24 hours) at 10/02/12 1059 Last data filed at 10/02/12 0921  Gross per 24 hour  Intake    480 ml  Output    750 ml  Net   -270 ml  General: sitting up at the side of the bed to eat breakfast, NAD  HEENT: PERRL, EOMI, MMM of OP, absent dentition, caries  Cardiac: RRR, no rubs, murmurs or gallops  Pulm: fine crackles in bilateral bases, good air movement, no wheezing or rhonchi, no accessory muscle use  Abd: soft, nontender, nondistended, BS present  Ext: 2+ pitting edema up to knees bilaterally slightly worse than yesterday, non-blanchable reddish-brown pigmented lesions over dorsal aspect bilateral feet; warm and well perfused, 2+ DP pulses  Neuro: alert and oriented X3, cranial nerves II-XII grossly intact, strength and sensation to light touch equal in bilateral upper and lower extremities  Lab Results: Basic Metabolic Panel:  Lab 10/02/12 5409 10/01/12 1847 10/01/12 1041  NA 137 -- 141  K 3.2* -- 3.7  CL 102 -- 105  CO2 26 -- 26  GLUCOSE 145* -- 218*  BUN 25* -- 30*  CREATININE 1.13* 1.16* --  CALCIUM 8.5 -- 8.8  MG -- -- --  PHOS -- -- --   Liver Function Tests:  Lab 09/28/12 2103  AST 26    ALT 27  ALKPHOS 97  BILITOT 0.9  PROT 6.8  ALBUMIN 3.1*   CBC:  Lab 10/01/12 1847 10/01/12 1041 09/28/12 2103  WBC 6.6 7.1 --  NEUTROABS -- -- 3.5  HGB 13.9 13.9 --  HCT 42.9 41.4 --  MCV 82.3 82.0 --  PLT 264 256 --   Cardiac Enzymes:  Lab 10/02/12 0150 10/01/12 2022 10/01/12 1351 10/01/12 1042  CKTOTAL -- -- 117 --  CKMB -- -- 5.2* --  CKMBINDEX -- -- -- --  TROPONINI <0.30 <0.30 -- <0.30   BNP:  Lab 10/01/12 1042 09/28/12 2103  PROBNP 6579.0* 6806.0*   CBG:  Lab 10/02/12 0654 10/02/12 0621 10/02/12 0554 10/01/12 2120  GLUCAP 97 63* 69* 221*   Hemoglobin A1C:  Lab 10/01/12 1351  HGBA1C 9.2*   Fasting Lipid Panel:  Lab 10/01/12 1355  CHOL 114  HDL 31*  LDLCALC 70  TRIG 66  CHOLHDL 3.7  LDLDIRECT --   Urinalysis:  Lab 09/28/12 2042  COLORURINE YELLOW  LABSPEC 1.017  PHURINE 5.0  GLUCOSEU NEGATIVE  HGBUR MODERATE*  BILIRUBINUR NEGATIVE  KETONESUR NEGATIVE  PROTEINUR >300*  UROBILINOGEN 1.0  NITRITE NEGATIVE  LEUKOCYTESUR SMALL*   Micro Results: Recent Results (from the past  240 hour(s))  URINE CULTURE     Status: Normal   Collection Time   09/28/12  8:42 PM      Component Value Range Status Comment   Specimen Description URINE, CLEAN CATCH   Final    Special Requests NONE   Final    Culture  Setup Time 09/29/2012 04:50   Final    Colony Count >=100,000 COLONIES/ML   Final    Culture ENTEROBACTER CLOACAE   Final    Report Status 09/30/2012 FINAL   Final    Organism ID, Bacteria ENTEROBACTER CLOACAE   Final    Studies/Results: Dg Chest 2 View  10/01/2012  *RADIOLOGY REPORT*  Clinical Data: Weakness, shortness of breath  CHEST - 2 VIEW  Comparison: 09/28/2012  Findings: Cardiomegaly again noted.  Dual lead cardiac pacemaker is unchanged in position.  Mild perihilar and infrahilar interstitial prominence.  Mild interstitial edema cannot be excluded.  Small bilateral pleural effusion with basilar atelectasis.  IMPRESSION: Dual lead cardiac  pacemaker is unchanged in position.  Mild perihilar and infrahilar interstitial prominence.  Mild interstitial edema cannot be excluded.  Small bilateral pleural effusion with basilar atelectasis.   Original Report Authenticated By: Natasha Mead, M.D.    Medications: I have reviewed the patient's current medications. Scheduled Meds:   . amitriptyline  10 mg Oral QHS  . aspirin EC  81 mg Oral Daily  . atorvastatin  20 mg Oral QHS  . [COMPLETED] cefTRIAXone (ROCEPHIN)  IV  1 g Intravenous Once  . enoxaparin  40 mg Subcutaneous Q24H  . [COMPLETED] furosemide  80 mg Intravenous Once  . [COMPLETED] furosemide  80 mg Intravenous Once  . insulin aspart  0-5 Units Subcutaneous QHS  . insulin aspart  0-9 Units Subcutaneous TID WC  . insulin glargine  30 Units Subcutaneous BID  . lisinopril  10 mg Oral Daily  . metoprolol tartrate  12.5 mg Oral BID  . sodium chloride  3 mL Intravenous Q12H  . [DISCONTINUED] furosemide  40 mg Intravenous Once  . [DISCONTINUED] furosemide  40 mg Oral Daily   Continuous Infusions:  PRN Meds:.sodium chloride, acetaminophen, ondansetron (ZOFRAN) IV, sodium chloride  Assessment/Plan: [name] is a 71 yo female w pmh of CHF, T2DM and HTN presenting with subacute worsening of SOB and LE edema concerning for exacerbation of CHF.   1) Acute on chronic CHF exacerbation  Pt presents with gradual worsening of SOB, LE edema in setting of heart failure. Patient cannot remember many details about diagnosis, but says she takes 40mg  furosemide daily. Does not know baseline EF. CXR demonstrates mild interstitial edema and small b/l pleural effusions. Her pro-BNP is 6579, slightly less than the value 4 days ago (6806) when she presented to the ED w similar symptoms. Reassuringly, no acute EKG changes, troponin negative x3. She received 80mg  IV lasix in the ED. Has appt w Dr. Alanson Puls for this Thursday.  Still w LE edema this morning, says it is worsening. 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 No chest pain or palpitations, has not noticed any strange rhythms of her heart. By EKG it looks like she continues to be atrial sensing, ventricular pacing.  - will attempt to get prior records  - cards follow up on Thursday as  above   3) HTN  Patient's BP is elevated, systolics 140s-160s diastolics 100s-110s. Takes metoprolol 12.5 BID at home, not on ACE inhibitor.  - Continue metoprolol 12.5 BID, start lisinopril 10 daily   4) T2DM  On lantus 30U BID at home. Takes blood glucose erratically, cannot remember values at home. Glucose on Bmet 218. HbA1c is 9.2, she will need better control. Has PCP here, will recommend evaluating regimen on follow-up.  - SSI while inpt   5) Likely chronic renal failure, stage 3  Pt's GFR is 48. This is improved from her visit 3 days ago, when GFR was 40. She says she has been told that she needs to be "careful" with her kidneys, but does not know of disease or diagnosis. Could be related to diabetic disease.  - Follow Cr in light of starting ACE-i  6) Possible obstructive pulmonary disease  Pt reports dx COPD but no PFTs in the past, not currently on inhaler therapy, no lung hyperinflation/emphesematous changes on radiography, and no wheezing. She has only smoked for a few years.  - She may need PFTs as an outpatient at some point if she develops sx of COPD    LOS: 1 day   Bronson Curb 10/02/2012, 10:59 AM

## 2012-10-02 NOTE — Plan of Care (Signed)
Problem: Food- and Nutrition-Related Knowledge Deficit (NB-1.1) Goal: Nutrition education Formal process to instruct or train a patient/client in a skill or to impart knowledge to help patients/clients voluntarily manage or modify food choices and eating behavior to maintain or improve health.  Outcome: Completed/Met Date Met:  10/02/12 Nutrition Education Note:   RD consulted for diet education with pt's daughter who is newly taking care of the pt. Daughter wanted hand outs for both CHF and DM diets. Daughter plans to attend DM diet classes held by RD at [facility]. Consider adding referral to Out Patient Nutrition [facility] Diabetes Management center as daughter mentioned that [facility] is a long drive for her.   Provided hand out for "Carbohydrate counting for people with diabetes" and "Low sodium nutrition therapy". Daughter wanted to look over the hand out's, RD provided brief overview of the recommendations but daughter wanted to read them later.   Pt his well educated, but very limited in po intake by not having many teeth. Consumes only very soft foods. Unable to tolerate whole wheat bread as it is too tough for example. Encouraged protein intake with beans and dairy products, and fiber intake from well cooked non-starchy vegetables.  Body mass index is 26.89 kg/(m^2). Pt is overweight per current BMI.  Diet: Heart Healthy (2 gm NA, 1500 ml fluids). PO intake: 75%  Chart reviewed, no additional nutrition interventions warranted. Please consult as needed.   Clarene Duke RD, LDN Pager [phone] After Hours pager [phone]

## 2012-10-02 NOTE — H&P (Signed)
Internal Medicine Attending Admission Note Date: 10/02/2012  Patient name: [name] [name] Medical record number: [id number] Date of birth: 01-30-1941 Age: 71 y.o. Gender: female  I saw and evaluated the patient. I reviewed the resident's note and I agree with the resident's findings and plan as documented in the resident's note, with the following additional comments.  Chief Complaint(s):  Shortness of breath  History - key components related to admission: Patient is a 71 year old woman with history of congestive heart failure, reported cardiac arrest status post dual-chamber pacemaker, hypertension, type 2 diabetes, hyperlipidemia, and other problems as outlined in the medical history admitted with complaint of progressively worsening dyspnea, orthopnea, and bilateral leg edema; her symptoms have worsened over the past one to 2 weeks.  She denies any history of chest pain.  She reports prior similar episodes of congestive heart failure and says that these usually respond to treatment with furosemide.  She recently moved here from [place] and has established care with Dr. Guerry Bruin as PCP.  Physical Exam - key components related to admission:  Filed Vitals:   10/01/12 1834 10/01/12 2100 10/02/12 0409 10/02/12 1028  BP: 171/91 153/82 117/58 120/60  Pulse: 89 84 76 89  Temp: 97.6 F (36.4 C) 98.3 F (36.8 C) 98.1 F (36.7 C)   TempSrc: Oral Oral Oral   Resp: 22 22 18    Height: 5\' 3"  (1.6 m)     Weight: 153 lb 11.2 oz (69.718 kg)  151 lb 12.8 oz (68.856 kg)   SpO2: 96% 98% 97%    General: Alert, mildly tachypnea Lungs: Basilar crackles; no wheezing Heart: Regular; S1-S2, no S3, no S4, no murmurs Abdomen: Bowel sounds present, soft, nontender Extremities: 2+ bilateral pitting leg edema   Lab results:   Basic Metabolic Panel:  Basename 10/02/12 0149 10/01/12 1847 10/01/12 1041  NA 137 -- 141  K 3.2* -- 3.7  CL 102 -- 105  CO2 26 -- 26  GLUCOSE 145* -- 218*  BUN 25* --  30*  CREATININE 1.13* 1.16* --  CALCIUM 8.5 -- 8.8  MG -- -- --  PHOS -- -- --    CBC:  Basename 10/01/12 1847 10/01/12 1041  WBC 6.6 7.1  NEUTROABS -- --  HGB 13.9 13.9  HCT 42.9 41.4  MCV 82.3 82.0  PLT 264 256   Cardiac Enzymes:  Basename 10/02/12 0150 10/01/12 2022 10/01/12 1351 10/01/12 1042  CKTOTAL -- -- 117 --  CKMB -- -- 5.2* --  CKMBINDEX -- -- -- --  TROPONINI <0.30 <0.30 -- <0.30   BNP    Component Value Date/Time   PROBNP 6579.0* 10/01/2012 1042    CBG:  Basename 10/02/12 1107 10/02/12 0654 10/02/12 0621 10/02/12 0554 10/01/12 2120  GLUCAP 177* 97 63* 69* 221*   Hemoglobin A1C:  Basename 10/01/12 1351  HGBA1C 9.2*   Fasting Lipid Panel:  Basename 10/01/12 1355  CHOL 114  HDL 31*  LDLCALC 70  TRIG 66  CHOLHDL 3.7  LDLDIRECT --    Urinalysis    Component Value Date/Time   COLORURINE YELLOW 09/28/2012 2042   APPEARANCEUR CLOUDY* 09/28/2012 2042   LABSPEC 1.017 09/28/2012 2042   PHURINE 5.0 09/28/2012 2042   GLUCOSEU NEGATIVE 09/28/2012 2042   HGBUR MODERATE* 09/28/2012 2042   BILIRUBINUR NEGATIVE 09/28/2012 2042   KETONESUR NEGATIVE 09/28/2012 2042   PROTEINUR >300* 09/28/2012 2042   UROBILINOGEN 1.0 09/28/2012 2042   NITRITE NEGATIVE 09/28/2012 2042   LEUKOCYTESUR SMALL* 09/28/2012 2042   Urine microscopic: WBCs 7-10,  RBCs 7-10, squamous epithelial few, bacteria many  Urine culture: Greater than 100,000 colonies per mL of Enterobacter cloacae  Imaging results:  Dg Chest 2 View  10/01/2012  *RADIOLOGY REPORT*  Clinical Data: Weakness, shortness of breath  CHEST - 2 VIEW  Comparison: 09/28/2012  Findings: Cardiomegaly again noted.  Dual lead cardiac pacemaker is unchanged in position.  Mild perihilar and infrahilar interstitial prominence.  Mild interstitial edema cannot be excluded.  Small bilateral pleural effusion with basilar atelectasis.  IMPRESSION: Dual lead cardiac pacemaker is unchanged in position.  Mild perihilar and infrahilar  interstitial prominence.  Mild interstitial edema cannot be excluded.  Small bilateral pleural effusion with basilar atelectasis.   Original Report Authenticated By: Natasha Mead, M.D.     Other results: EKG: Atrial sensed ventricular paced rhythm   Assessment & Plan by Problem:  1.  Congestive heart failure.  Patient has a history of chronic heart failure and reports similar exacerbations in the past.  Her symptoms were gradual in onset, and she has no evidence of acute MI by enzymes and no history of chest pain.  Plan is diurese with IV furosemide; follow ins and outs, daily weights, electrolytes, and renal function closely; 2-D echocardiogram.  Patient has a cardiology appointment for this Thursday already scheduled.  2.  Reported history of cardiac arrest status post dual-chamber pacemaker.  We need to get outside records regarding her treatment in [place] if possible.  I asked her daughter if she could provide names and addresses of the physicians who treated her there so we can request records.  3.  Diabetes mellitus, poorly controlled.  Plan is follow blood sugars and adjust regimen as indicated.   4.  Enterobacter urinary tract infection.  Plan is treat and follow for resolution.

## 2012-10-02 NOTE — Progress Notes (Signed)
UR Chart Review Completed  

## 2012-10-02 NOTE — Progress Notes (Signed)
  Echocardiogram 2D Echocardiogram has been performed.  [name] [name] FRANCES 10/02/2012, 3:45 PM

## 2012-10-02 NOTE — Progress Notes (Signed)
Internal Medicine Teaching Service Night Float Progress Note:  Called by RN for un-witnessed fall this evening.  RN claims [name] [name] was trying to get out of bed and sit in her chair when her legs felt very weak.  She then decided to put a pillow on the ground and sit on the pillow until nurses came to assist.  She denies actually falling or any trauma or hitting her head.  She claims she has had episodes of b/l leg weakness in the past at home as well.    I went to go see [name] [name] who is lying in bed. She claims her bed is slippery and that she keeps sliding off.  Currently both hand rails are up on the bed with bed alarm on and RN present in room as well.  [name] [name] confirms she was trying to sit up in the chair from her bed but could not pull her right leg off to the ground to get up.  That is when she decided to put a pillow on the floor and sit down instead of sitting back in the bed.  She denies any trauma, dizziness, or loss of consciousness.  She claims this happens at home as well and that she has fallen occasionally as well.  The RN notified the daughter as well.  [name] [name] currently has no complaints, vital signs stable, and she is alert, awake, and oriented to person, place and time.  She was informed to remain in bed and ask for RN assistance when needed and that a bed alarm will be placed.  On physical exam, she is noted to have +1-2 b/l pitting edema, +2DP, with 5/5 strength in b/l lower extremities and sensation grossly in tact.    -will notify day team -continue to monitor  [name] [name] 10/02/12 1018pm

## 2012-10-02 NOTE — Progress Notes (Signed)
Inpatient Diabetes Program Recommendations  AACE/ADA: New Consensus Statement on Inpatient Glycemic Control (2013)  Target Ranges:  Prepandial:   less than 140 mg/dL      Peak postprandial:   less than 180 mg/dL (1-2 hours)      Critically ill patients:  140 - 180 mg/dL   Reason for Visit: Talk with patient regarding A1C of 9.2.     Note: Patient is unsure of what her previous A1Cs were.  Explained A1C value and goal range for diabetic. Discussed home diabetic regimen with patient and her daughter.  Patient has recently moved here from [place] and has seen a local physician twice since being in [place].  According to the patient and her daughter, she checks her sugar and takes her insulin as ordered.  However, due to lack of teeth, patient is unable to eat foods unless they are very soft.  Patient's daughter states that she works and she purchases frozen meals for patient that are soft and already prepared but just requires that they be heated up or cooked in the microwave.  Patient's daughter states that she knows she needs to make better dietary choices for her mother and would like to have more education on a low sodium diabetic diet.  Requested a dietician consult and also provided her with information for free Diabetes/Nutrution classes held at [facility].  Will continue to follow.  Thanks, Orlando Penner, RN, BSN, CCRN Diabetes Coordinator Inpatient Diabetes Program [phone]

## 2012-10-03 ENCOUNTER — Encounter (HOSPITAL_COMMUNITY): Payer: Self-pay | Admitting: Cardiology

## 2012-10-03 LAB — BASIC METABOLIC PANEL
BUN: 25 mg/dL — ABNORMAL HIGH (ref 6–23)
Chloride: 103 mEq/L (ref 96–112)
GFR calc Af Amer: 34 mL/min — ABNORMAL LOW (ref >90)
Glucose, Bld: 86 mg/dL (ref 70–99)
Potassium: 3.9 mEq/L (ref 3.5–5.1)

## 2012-10-03 LAB — GLUCOSE, CAPILLARY: Glucose-Capillary: 163 mg/dL — ABNORMAL HIGH (ref 70–99)

## 2012-10-03 MED ORDER — SODIUM CHLORIDE 0.9 % IV SOLN
INTRAVENOUS | Status: AC
  Administered 2012-10-03: 11:00:00 via INTRAVENOUS

## 2012-10-03 MED ORDER — FUROSEMIDE 10 MG/ML IJ SOLN
80.0000 mg | Freq: Once | INTRAMUSCULAR | Status: DC
Start: 2012-10-03 — End: 2012-10-03
  Administered 2012-10-03: 80 mg via INTRAVENOUS
  Filled 2012-10-03: qty 8

## 2012-10-03 MED ORDER — SODIUM CHLORIDE 0.9 % IV BOLUS (SEPSIS)
250.0000 mL | Freq: Once | INTRAVENOUS | Status: AC
  Administered 2012-10-03: 250 mL via INTRAVENOUS

## 2012-10-03 NOTE — Progress Notes (Addendum)
Called by staff RN to pt's room and found pt sitting on the pillow in the floor, pt verbalized that she was trying to sit up in the chair from her bed and felt that her right leg is weak so she took the pillow and placed it the floor and sat down and called for help. Pt claimed that she did not fall, pt is alert and oriented, no injury noted, vital signs obtained. Pt placed back in bed, fall risk arm band and red socks placed, bed alarm on, pt re-educated about using call bell at all times and ask for assistance when needed. Dr. Norman Clay was notified and came to check the patient, [facility] was informed. Will monitor closely.

## 2012-10-03 NOTE — Care Management Note (Signed)
    Page 1 of 1   10/03/2012     3:17:43 PM   CARE MANAGEMENT NOTE 10/03/2012  Patient:  [name] [name],[name] [name]   Account Number:  [id number]  Date Initiated:  10/03/2012  Documentation initiated by:  Donn Pierini  Subjective/Objective Assessment:   Pt admitted wtih SOB/ CHF     Action/Plan:   PTA pt lived at home alone   Anticipated DC Date:  10/05/2012   Anticipated DC Plan:  HOME W HOME HEALTH SERVICES      DC Planning Services  CM consult      Choice offered to / List presented to:             Status of service:  In process, will continue to follow Medicare Important Message given?   (If response is "NO", the following Medicare IM given date fields will be blank) Date Medicare IM given:   Date Additional Medicare IM given:    Discharge Disposition:    Per UR Regulation:  Reviewed for med. necessity/level of care/duration of stay  If discussed at Long Length of Stay Meetings, dates discussed:    Comments:  10/03/12- 1500 - Donn Pierini RN, BSN [phone] Spoke with pt and daughter at bedside regarding d/c needs- per conversation pt states that she lives in an apartment- moved up here from [place] in April. When pt lived in [place] she had Medicaid and had an aide that came 3hr/3x week- she has not applied for Medicaid in [place] yet- encouraged pt and daughter to apply as soon as they can as it takes time to go through the application process- and then if approved to apply for the Personal Care services through her PCP. Pt's daughter to f/u on applying for Medicaid and also for food stamps in order to try to get further assistance for her mother at home. Pt did have a scale in [place] but did not bring with her- daughter and pt agreeable to [facility] for CHF management at discharge. List of [facility] agencies for [facility] left with daughter. NCM to f/u tomorrow regarding choice- left sticky note in epic for MD regarding [facility] RN order.

## 2012-10-03 NOTE — Progress Notes (Addendum)
Subjective: Last night RN found pt sitting on pillow on floor. She says she felt her foot slipping and leg giving out, so she decided to sit on a pillow and wait for the nurse to assist her in standing. She says this happens to her once in a while at home.  This morning, her LE edema has improved and she has not had any SOB. She is 3lbs down from admission weight, -2.6 liters, although nurse reports that all msmts may not have been recorded. Echo performed yesterday, read pending. Records obtained from cardiologist in [place] show EF 20% w global hypokinesis in June 2013. [name] [name] says this morning that she is ready to go home, and wants to follow up with her cardiologist. Denies CP, SOB, N/V, dizziness, abdominal pain, diarrhea.  Objective: Vital signs in last 24 hours: Filed Vitals:   10/02/12 2037 10/02/12 2055 10/03/12 0203 10/03/12 0526  BP: 125/77 155/84 152/83 140/75  Pulse: 82 88 83 85  Temp: 98.9 F (37.2 C) 98.4 F (36.9 C)  98.9 F (37.2 C)  TempSrc: Oral Oral  Oral  Resp: 19 18 18 18   Height:      Weight:    150 lb 11.2 oz (68.357 kg)  SpO2: 92% 94% 91% 95%   Weight change: -3 lb (-1.361 kg)  Intake/Output Summary (Last 24 hours) at 10/03/12 0812 Last data filed at 10/02/12 2158  Gross per 24 hour  Intake    720 ml  Output   1550 ml  Net   -830 ml  General: sitting up at the side of the bed to eat breakfast, NAD  HEENT: PERRL, EOMI, MMM of OP, absent dentition, caries  Cardiac: RRR, no rubs, murmurs or gallops  Pulm: no rales appreciable, good air movement, no wheezing or rhonchi, no accessory muscle use  Abd: soft, nontender, nondistended, BS present  Ext: 1+ pitting edema up to knees bilaterally slightly worse than yesterday, non-blanchable reddish-brown pigmented lesions over dorsal aspect bilateral feet; warm and well perfused, 2+ DP pulses  Neuro: alert and oriented X3, cranial nerves II-XII grossly intact, strength and sensation to light touch equal in bilateral  upper and lower extremities  Lab Results: Basic Metabolic Panel:  Lab 10/03/12 1610 10/02/12 0149  NA 139 137  K 3.9 3.2*  CL 103 102  CO2 25 26  GLUCOSE 86 145*  BUN 25* 25*  CREATININE 1.67* 1.13*  CALCIUM 8.7 8.5  MG -- --  PHOS -- --   Liver Function Tests:  Lab 09/28/12 2103  AST 26  ALT 27  ALKPHOS 97  BILITOT 0.9  PROT 6.8  ALBUMIN 3.1*   CBC:  Lab 10/01/12 1847 10/01/12 1041 09/28/12 2103  WBC 6.6 7.1 --  NEUTROABS -- -- 3.5  HGB 13.9 13.9 --  HCT 42.9 41.4 --  MCV 82.3 82.0 --  PLT 264 256 --   Cardiac Enzymes:  Lab 10/02/12 0150 10/01/12 2022 10/01/12 1351 10/01/12 1042  CKTOTAL -- -- 117 --  CKMB -- -- 5.2* --  CKMBINDEX -- -- -- --  TROPONINI <0.30 <0.30 -- <0.30   BNP:  Lab 10/01/12 1042 09/28/12 2103  PROBNP 6579.0* 6806.0*   CBG:  Lab 10/03/12 0611 10/02/12 2112 10/02/12 1601 10/02/12 1107 10/02/12 0654 10/02/12 0621  GLUCAP 91 107* 102* 177* 97 63*   Hemoglobin A1C:  Lab 10/01/12 1351  HGBA1C 9.2*   Fasting Lipid Panel:  Lab 10/01/12 1355  CHOL 114  HDL 31*  LDLCALC 70  TRIG  66  CHOLHDL 3.7  LDLDIRECT --   Urinalysis:  Lab 10/02/12 1536 09/28/12 2042  COLORURINE YELLOW YELLOW  LABSPEC 1.006 1.017  PHURINE 7.0 5.0  GLUCOSEU NEGATIVE NEGATIVE  HGBUR NEGATIVE MODERATE*  BILIRUBINUR NEGATIVE NEGATIVE  KETONESUR NEGATIVE NEGATIVE  PROTEINUR 100* >300*  UROBILINOGEN 1.0 1.0  NITRITE NEGATIVE NEGATIVE  LEUKOCYTESUR NEGATIVE SMALL*   Micro Results: Recent Results (from the past 240 hour(s))  URINE CULTURE     Status: Normal   Collection Time   09/28/12  8:42 PM      Component Value Range Status Comment   Specimen Description URINE, CLEAN CATCH   Final    Special Requests NONE   Final    Culture  Setup Time 09/29/2012 04:50   Final    Colony Count >=100,000 COLONIES/ML   Final    Culture ENTEROBACTER CLOACAE   Final    Report Status 09/30/2012 FINAL   Final    Organism ID, Bacteria ENTEROBACTER CLOACAE   Final     Studies/Results: Dg Chest 2 View  10/01/2012  *RADIOLOGY REPORT*  Clinical Data: Weakness, shortness of breath  CHEST - 2 VIEW  Comparison: 09/28/2012  Findings: Cardiomegaly again noted.  Dual lead cardiac pacemaker is unchanged in position.  Mild perihilar and infrahilar interstitial prominence.  Mild interstitial edema cannot be excluded.  Small bilateral pleural effusion with basilar atelectasis.  IMPRESSION: Dual lead cardiac pacemaker is unchanged in position.  Mild perihilar and infrahilar interstitial prominence.  Mild interstitial edema cannot be excluded.  Small bilateral pleural effusion with basilar atelectasis.   Original Report Authenticated By: Natasha Mead, M.D.    Medications: I have reviewed the patient's current medications. Scheduled Meds:    . amitriptyline  10 mg Oral QHS  . aspirin EC  81 mg Oral Daily  . atorvastatin  20 mg Oral QHS  . enoxaparin  40 mg Subcutaneous Q24H  . [COMPLETED] furosemide  80 mg Intravenous Once  . furosemide  80 mg Intravenous Once  . insulin aspart  0-5 Units Subcutaneous QHS  . insulin aspart  0-9 Units Subcutaneous TID WC  . insulin glargine  30 Units Subcutaneous BID  . lisinopril  10 mg Oral Daily  . metoprolol tartrate  12.5 mg Oral BID  . [COMPLETED] potassium chloride SA  40 mEq Oral BID  . sodium chloride  3 mL Intravenous Q12H  . sulfamethoxazole-trimethoprim  1 tablet Oral Q12H  . [DISCONTINUED] furosemide  40 mg Intravenous Once  . [DISCONTINUED] furosemide  40 mg Oral Daily   Continuous Infusions:  PRN Meds:.sodium chloride, acetaminophen, ondansetron (ZOFRAN) IV, sodium chloride  Assessment/Plan: [name] [name] is a 71 yo female w pmh of CHF, T2DM and HTN presenting with subacute worsening of SOB and LE edema concerning for exacerbation of CHF.   1) Acute on chronic CHF exacerbation  Pt presents with gradual worsening of SOB, LE edema in setting of heart failure. Patient cannot remember many details about diagnosis, but  says she takes 40mg  furosemide daily. Does not know baseline EF. Per outside records, pt w EF in 6/13 w EF 20%, global hypokinesis, pt intermittently compliant with medications. CXR demonstrates mild interstitial edema and small b/l pleural effusions. Her pro-BNP is 6579, slightly less than the value 4 days ago (6806) when she presented to the ED w similar symptoms. Reassuringly, no acute EKG changes, troponin negative x3.  LE edema and SOB have improved. Wt down 3lbs, I/Os not accurate, at least down 2.5L. Echo pending. S/p Lasix 80mg   IV x3 days. 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 Per records, cause is unclear, it seems she has defibrillator and pacer and history of ischemic CM. No chest pain or palpitations, has not noticed any strange rhythms of her heart. By EKG it looks like she continues to be atrial sensing, ventricular pacing.   - cards follow up on Thursday as above   3) HTN  Patient's BP is elevated, systolics 140s-160s diastolics 100s-110s. Takes metoprolol 12.5 BID at home, was supposed to be on ACE-i per outside records, pt not taking - Continue metoprolol 12.5 BID, start lisinopril 10 daily   4) T2DM  On lantus 30U BID at home. Takes blood glucose erratically, cannot remember values at home. Glucose on Bmet 218. HbA1c is 9.2, she will need better control; however this is down from 12.2 from her outside records. Has PCP here, will recommend evaluating regimen on follow-up.  - SSI while inpt   5) Likely chronic renal failure, stage 3  Pt's GFR is 48. This is improved from her visit 3 days ago, when GFR was 40. She says she has been told that she needs to be "careful" with her kidneys, but does not know of disease or diagnosis. Could be related to diabetic disease. Cr in June 2013 was also elevated (1.4) per outside records. - Follow Cr in light of  starting ACE-i  6) Possible obstructive pulmonary disease  Pt reports dx COPD but no PFTs in the past, not currently on inhaler therapy, no lung hyperinflation/emphesematous changes on radiography, and no wheezing. She has only smoked for a few years.  - She may need PFTs as an outpatient at some point if she develops sx of COPD    LOS: 2 days   Bronson Curb 10/03/2012, 8:12 AM

## 2012-10-03 NOTE — Progress Notes (Signed)
Internal Medicine Attending  Date: 10/03/2012  Patient name: [name] [name] Medical record number: [id number] Date of birth: 1941-10-23 Age: 71 y.o. Gender: female  I saw and evaluated the patient on a.m. rounds with house staff. I reviewed the resident's note by Dr. Heloise Beecham and I agree with the resident's findings and plans as documented in her note, with the following additional comments.  Patient is breathing much better, and her lower extremity edema has largely resolved; she has no crackles on lung exam.  Her 2-D echocardiogram showed severely reduced left ventricular systolic function, with an estimated ejection fraction in the range of 25% to 30%, akinesis of the entire anteroseptal myocardium, and moderate to severe mitral regurgitation.  The partial records that we have been able to obtain from her care in [place] include a prior echocardiogram from earlier this year that also showed severely reduced left ventricular systolic function, and there is a mention of an ischemic cardiomyopathy.  Patient reports having had a cardiac catheterization in the past, but we do not have records and we will try to obtain them; her daughter will try to find out the name of the hospital where catheterization was done in [place].  Although patient has responded well to diuresis, her creatinine increased from 1.13 to 1.67 overnight.  I agree with the plan to consult cardiology while she is here.

## 2012-10-03 NOTE — ED Notes (Signed)
Chart returned from EDP office Patient admitted. 

## 2012-10-03 NOTE — Consult Note (Signed)
Reason for Consult:  CHF  Referring Physician:    [name] is an 71 y.o. female.  HPI:    The patient is a 71 year old Caucasian female with history of tobacco abuse(quit over 10 years ago), diabetes mellitus (uncontrolled), hypertension, nonischemic cardiomyopathy, cardiac arrest in 2006( [employer] PPMICD implanted), hyperlipidemia, chronic renal insufficiency stage III.    The patient was scheduled to see one of the cardiologists at [facility] heart [facility] vascular tomorrow.   She just recently moved from [place]. Her cardiologist was Dr. Duwayne Heck.  A 2-D echocardiogram performed on June of 2013 showed ejection fraction of 20% or less. Her severe global hypokinesis. Wall thickness was within normal limits. Left atrium is mildly dilated measuring 4.4 cm. The proximal pole aortic root was 2.8 cm with mild to moderate MR. The pulmonary systolic pressures were 60-65 mmHg.  Mild tricuspid regurg. There is no indication in the records from [place] that she's had a left heart catheterization.      Patient presented on November 10 with progressive lower extremity edema and shortness of breath.  She denies nausea, vomiting, fever, chest pain, orthopnea, PND, palpitations, dizziness, abdominal pain and currently has no lower extremity edema.  BNP upon arrival was 6579.  She was diuresed approximately 1.5 L since admission.  Back in June 2013 [place] patient reported that she had stopped taking her medications because she felt better off of them.  Past Medical History  Diagnosis Date  . Diabetes mellitus   . Hypertension   . CHF (congestive heart failure)   . Cardiac arrest     2006, etiology unknown, s/p dual chamber pacemaker for secondary prevention  . Hyperlipidemia   . Chronic renal insufficiency, stage III (moderate)     Etiology unknown    Past Surgical History  Procedure Date  . Pacemaker insertion     Dual chamber pacemaker, 2006, for secondary prevention cardiac arrest  . Tubal ligation      Family History  Problem Relation Age of Onset  . Heart disease Brother   . Diabetes Mother   . Diabetes Father     Social History:  reports that she has quit smoking. Her smoking use included Cigarettes. She has a 3 pack-year smoking history. She does not have any smokeless tobacco history on file. She reports that she does not drink alcohol or use illicit drugs.  Allergies:  Allergies  Allergen Reactions  . Codeine Nausea Only    Medications:     . amitriptyline  10 mg Oral QHS  . aspirin EC  81 mg Oral Daily  . atorvastatin  20 mg Oral QHS  . enoxaparin  40 mg Subcutaneous Q24H  . insulin aspart  0-5 Units Subcutaneous QHS  . insulin aspart  0-9 Units Subcutaneous TID WC  . insulin glargine  30 Units Subcutaneous BID  . lisinopril  10 mg Oral Daily  . metoprolol tartrate  12.5 mg Oral BID  . [COMPLETED] potassium chloride SA  40 mEq Oral BID  . [COMPLETED] sodium chloride  250 mL Intravenous Once  . sodium chloride  3 mL Intravenous Q12H  . sulfamethoxazole-trimethoprim  1 tablet Oral Q12H  . [COMPLETED] furosemide  80 mg Intravenous Once     Results for orders placed during the hospital encounter of 10/01/12 (from the past 48 hour(s))  CBC     Status: Abnormal   Collection Time   10/01/12  6:47 PM      Component Value Range Comment   WBC 6.6  4.0 - 10.5 K/uL    RBC 5.21 (*) 3.87 - 5.11 MIL/uL    Hemoglobin 13.9  12.0 - 15.0 g/dL    HCT 16.1  09.6 - 04.5 %    MCV 82.3  78.0 - 100.0 fL    MCH 26.7  26.0 - 34.0 pg    MCHC 32.4  30.0 - 36.0 g/dL    RDW 40.9  81.1 - 91.4 %    Platelets 264  150 - 400 K/uL   CREATININE, SERUM     Status: Abnormal   Collection Time   10/01/12  6:47 PM      Component Value Range Comment   Creatinine, Ser 1.16 (*) 0.50 - 1.10 mg/dL    GFR calc non Af Amer 46 (*) >90 mL/min    GFR calc Af Amer 53 (*) >90 mL/min   TROPONIN I     Status: Normal   Collection Time   10/01/12  8:22 PM      Component Value Range Comment   Troponin  I <0.30  <0.30 ng/mL   GLUCOSE, CAPILLARY     Status: Abnormal   Collection Time   10/01/12  9:20 PM      Component Value Range Comment   Glucose-Capillary 221 (*) 70 - 99 mg/dL    Comment 1 Documented in Chart      Comment 2 Notify RN     BASIC METABOLIC PANEL     Status: Abnormal  GLUCOSE, CAPILLARY     Status: Normal   Collection Time   10/03/12  6:11 AM      Component Value Range Comment   Glucose-Capillary 91  70 - 99 mg/dL   BASIC METABOLIC PANEL     Status: Abnormal   Collection Time   10/03/12  6:12 AM      Component Value Range Comment   Sodium 139  135 - 145 mEq/L    Potassium 3.9  3.5 - 5.1 mEq/L DELTA CHECK NOTED   Chloride 103  96 - 112 mEq/L    CO2 25  19 - 32 mEq/L    Glucose, Bld 86  70 - 99 mg/dL    BUN 25 (*) 6 - 23 mg/dL    Creatinine, Ser 7.82 (*) 0.50 - 1.10 mg/dL    Calcium 8.7  8.4 - 95.6 mg/dL    GFR calc non Af Amer 29 (*) >90 mL/min    GFR calc Af Amer 34 (*) >90 mL/min   GLUCOSE, CAPILLARY     Status: Abnormal   Collection Time   10/03/12 11:14 AM      Component Value Range Comment   Glucose-Capillary 163 (*) 70 - 99 mg/dL    Comment 1 Notify RN      2D Echo. Study Conclusions  - Left ventricle: The cavity size was mildly dilated.  Systolic function was severely reduced. The estimated ejection fraction was in the range of 25% to 30%. There is akinesis of the entireanteroseptal myocardium. - Mitral valve: Calcified annulus. Moderate to severe regurgitation. - Left atrium: The atrium was mildly dilated. - Right ventricle: The cavity size was mildly dilated. Wall thickness was normal. - Atrial septum: No defect or patent foramen ovale was identified. - Tricuspid valve: Moderate regurgitation. - Pulmonary arteries: PA peak pressure: 55mm Hg (S) -- consistent with moderate pulmonary hypertension.  No results found.  Review of Systems  Constitutional: Negative for fever and diaphoresis.  HENT: Negative for congestion and sore throat.  Respiratory: Negative for cough and shortness of breath.   Cardiovascular: Negative for chest pain, palpitations, orthopnea, leg swelling and PND.  Gastrointestinal: Negative for nausea, vomiting, abdominal pain, diarrhea, constipation, blood in stool and melena.  Genitourinary: Negative for dysuria and hematuria.  Neurological: Negative for dizziness.   Blood pressure 108/57, pulse 78, temperature 98.9 F (37.2 C), temperature source Oral, resp. rate 20, height 5\' 3"  (1.6 m), weight 68.357 kg (150 lb 11.2 oz), SpO2 90.00%. Physical Exam  Constitutional: She is oriented to person, place, and time. She appears well-developed and well-nourished. No distress.  HENT:  Head: Normocephalic and atraumatic.  Eyes: EOM are normal. Pupils are equal, round, and reactive to light. No scleral icterus.  Neck: Normal range of motion. Neck supple.  Cardiovascular: Normal rate, regular rhythm, S1 normal and S2 normal.   No murmur heard. Pulses:      Radial pulses are 2+ on the right side, and 2+ on the left side.       Dorsalis pedis pulses are 2+ on the right side, and 2+ on the left side.       No Carotid Bruits.  Respiratory: Effort normal and breath sounds normal. She has no wheezes. She has no rales.  GI: Soft. Bowel sounds are normal. She exhibits no distension. There is no tenderness.  Musculoskeletal: She exhibits no edema.  Lymphadenopathy:    She has no cervical adenopathy.  Neurological: She is alert and oriented to person, place, and time.  Skin: Skin is warm and dry.  Psychiatric: She has a normal mood and affect.    Assessment/Plan: Patient Active Hospital Problem List: Acute on Chronic Systolic Heart Failure. Hypertension (10/01/2012) S/P placement of cardiac pacemaker (10/01/2012) Hyperlipidemia (10/01/2012) Hypokalemia Diabetes mellitus (10/01/2012) Hx of Tobacco abuse.  Plan:  The patient appears to be well compensated at this point.   ~1.5 L diuresed since admission.  No  orthopnea or overt signs of HF exacerbation currently.  Potassium improved. EF about the same as June.  BP and HR are stable.  Will need daily PO lasix.  Sodium restriction and daily weights at home.  We can arrange FU at [facility].    [name] [name], [name] [name] 10/03/2012, 2:44 PM   I seen and evaluated the patient this morning along with Wilburt Finlay, PA. I agree with their findings, examination as well as impression recommendations.  Patient with long standing history of Dilated Cardiomyopathy -- not sure if she every had an ischemic evaluation.  EF here is 25-30%. She was admitted for A on C combined S&D HF -- feeling much better after diuresis.  Based upon the rise in Cr., it would appear that she has been adequately diuresed.  She is on an ACE-I & BB (low dose bid Lopressor as opposed to daily Toprol - for $ reasons). She will likely need standing PO lasix - starting tomorrow.  She seems to be reaching time for discharge & was originally scheduled to see someone @ our office this week - will reschedule for close HF f/u appointment with BNP & BMP.  Would consider adding back spironolactone once we are sure of renal fxn -- would help with K+ levels.  Can determine need for ischemia evaluation as OP.  Marykay Lex, M.D., M.S. [facility] [address] [place], [place]  [address]  [phone] Pager # [phone] 10/03/2012 5:22 PM

## 2012-10-04 DIAGNOSIS — I429 Cardiomyopathy, unspecified: Secondary | ICD-10-CM | POA: Diagnosis present

## 2012-10-04 DIAGNOSIS — N289 Disorder of kidney and ureter, unspecified: Secondary | ICD-10-CM | POA: Diagnosis not present

## 2012-10-04 LAB — BASIC METABOLIC PANEL
CO2: 26 mEq/L (ref 19–32)
Calcium: 9 mg/dL (ref 8.4–10.5)
GFR calc non Af Amer: 24 mL/min — ABNORMAL LOW (ref >90)
Sodium: 137 mEq/L (ref 135–145)

## 2012-10-04 LAB — GLUCOSE, CAPILLARY: Glucose-Capillary: 156 mg/dL — ABNORMAL HIGH (ref 70–99)

## 2012-10-04 MED ORDER — ENOXAPARIN SODIUM 30 MG/0.3ML ~~LOC~~ SOLN
30.0000 mg | SUBCUTANEOUS | Status: DC
  Filled 2012-10-04: qty 0.3

## 2012-10-04 MED ORDER — CIPROFLOXACIN HCL 250 MG PO TABS: 250.0000 mg | ORAL_TABLET | Freq: Two times a day (BID) | ORAL | Status: DC

## 2012-10-04 NOTE — Progress Notes (Signed)
Subjective: LE edema improved, no shortness of breath. Cr is uptrending in setting of diuresis (Cr 1.9 this am). Down 7lbs since admission. Holding lasix today. Cardiology saw yesterday and recommended outpatient management of cardiomyopathy. Pt eager to return home and follow-up with cardiologist. Denies CP, SOB, N/V, dizziness, abdominal pain, diarrhea.  Objective: Vital signs in last 24 hours: Filed Vitals:   10/03/12 1410 10/03/12 2108 10/03/12 2319 10/04/12 0435  BP: 108/57 134/60 140/71 149/74  Pulse: 78 78 83 83  Temp: 98.9 F (37.2 C) 99.5 F (37.5 C)  97.5 F (36.4 C)  TempSrc: Oral Oral  Oral  Resp: 20 18  17   Height:      Weight:    146 lb 8 oz (66.452 kg)  SpO2: 90% 91%  89%   Weight change: -4 lb 3.2 oz (-1.905 kg)  Intake/Output Summary (Last 24 hours) at 10/04/12 0917 Last data filed at 10/04/12 0824  Gross per 24 hour  Intake    903 ml  Output   1150 ml  Net   -247 ml  General: sitting up in her chair HEENT: PERRL, EOMI, MMM of OP, absent dentition, caries  Cardiac: RRR, no rubs, murmurs or gallops  Pulm: Satting 95% on room air, breathing comfortably. No wheezes or rales. Good air movement.  Abd: soft, nontender, nondistended, BS present  Ext: Only trace edema today. Painless Neuro: alert and oriented X3, cranial nerves II-XII grossly intact, strength and sensation to light touch equal in bilateral upper and lower extremities  Lab Results: Basic Metabolic Panel:  Lab 10/04/12 1610 10/03/12 0612  NA 137 139  K 4.2 3.9  CL 102 103  CO2 26 25  GLUCOSE 86 86  BUN 27* 25*  CREATININE 1.97* 1.67*  CALCIUM 9.0 8.7  MG -- --  PHOS -- --   Liver Function Tests:  Lab 09/28/12 2103  AST 26  ALT 27  ALKPHOS 97  BILITOT 0.9  PROT 6.8  ALBUMIN 3.1*   CBC:  Lab 10/01/12 1847 10/01/12 1041 09/28/12 2103  WBC 6.6 7.1 --  NEUTROABS -- -- 3.5  HGB 13.9 13.9 --  HCT 42.9 41.4 --  MCV 82.3 82.0 --  PLT 264 256 --   Cardiac Enzymes:  Lab 10/02/12  0150 10/01/12 2022 10/01/12 1351 10/01/12 1042  CKTOTAL -- -- 117 --  CKMB -- -- 5.2* --  CKMBINDEX -- -- -- --  TROPONINI <0.30 <0.30 -- <0.30   BNP:  Lab 10/01/12 1042 09/28/12 2103  PROBNP 6579.0* 6806.0*   CBG:  Lab 10/04/12 0616 10/03/12 2104 10/03/12 1632 10/03/12 1114 10/03/12 0611 10/02/12 2112  GLUCAP 87 156* 104* 163* 91 107*   Hemoglobin A1C:  Lab 10/01/12 1351  HGBA1C 9.2*   Fasting Lipid Panel:  Lab 10/01/12 1355  CHOL 114  HDL 31*  LDLCALC 70  TRIG 66  CHOLHDL 3.7  LDLDIRECT --   Urinalysis:  Lab 10/02/12 1536 09/28/12 2042  COLORURINE YELLOW YELLOW  LABSPEC 1.006 1.017  PHURINE 7.0 5.0  GLUCOSEU NEGATIVE NEGATIVE  HGBUR NEGATIVE MODERATE*  BILIRUBINUR NEGATIVE NEGATIVE  KETONESUR NEGATIVE NEGATIVE  PROTEINUR 100* >300*  UROBILINOGEN 1.0 1.0  NITRITE NEGATIVE NEGATIVE  LEUKOCYTESUR NEGATIVE SMALL*   Micro Results: Recent Results (from the past 240 hour(s))  URINE CULTURE     Status: Normal   Collection Time   09/28/12  8:42 PM      Component Value Range Status Comment   Specimen Description URINE, CLEAN CATCH   Final  Special Requests NONE   Final    Culture  Setup Time 09/29/2012 04:50   Final    Colony Count >=100,000 COLONIES/ML   Final    Culture ENTEROBACTER CLOACAE   Final    Report Status 09/30/2012 FINAL   Final    Organism ID, Bacteria ENTEROBACTER CLOACAE   Final    Studies/Results: No results found. Medications: I have reviewed the patient's current medications. Scheduled Meds:    . amitriptyline  10 mg Oral QHS  . aspirin EC  81 mg Oral Daily  . atorvastatin  20 mg Oral QHS  . enoxaparin  40 mg Subcutaneous Q24H  . insulin aspart  0-5 Units Subcutaneous QHS  . insulin aspart  0-9 Units Subcutaneous TID WC  . insulin glargine  30 Units Subcutaneous BID  . metoprolol tartrate  12.5 mg Oral BID  . [COMPLETED] sodium chloride  250 mL Intravenous Once  . sodium chloride  3 mL Intravenous Q12H  . [COMPLETED] furosemide   80 mg Intravenous Once  . [DISCONTINUED] lisinopril  10 mg Oral Daily  . [DISCONTINUED] sulfamethoxazole-trimethoprim  1 tablet Oral Q12H   Continuous Infusions:    . [EXPIRED] sodium chloride 75 mL/hr at 10/03/12 1127   PRN Meds:.sodium chloride, acetaminophen, ondansetron (ZOFRAN) IV, sodium chloride  Assessment/Plan: Ms. Gaughan is a 71 yo female w pmh of CHF, T2DM and HTN presenting with subacute worsening of SOB and LE edema concerning for exacerbation of CHF.   1) Acute on chronic CHF exacerbation  History of ischemia uncertain, not successful thus far in obtaining any kind of cath records. Prior echo in 6/13 w EF 20%, global hypokinesis, pt intermittently compliant with medications. Here echo showed EF 25%-30% with akinesis of the entireanteroseptal myocardium. Cards has seen [facility], Dr. Herbie Baltimore), recommend outpatient follow-up for management of cardiomyopathy.  S/p Lasix 80mg  IV x3 days. Cr is uptrending in setting of diuresis, 1.9 today. Will hold IV lasix dose today. LE edema and SOB have improved. Wt down 7lbs, I/Os not accurate, at least down 2.5L.  Pt eager to return home. Anticipate d/c today - Per cards, in setting of renal function, hold lasix 2 days, and have her restart home dose 40mg  on Friday. 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 Per records, cause is unclear, it seems she has defibrillator and pacer and history of ischemic CM. No chest pain or palpitations, has not noticed any strange rhythms of her heart. By EKG it looks like she continues to be atrial sensing, ventricular pacing.   - F/u w cards outpatient, they will see her in one week in the clinic  3) HTN  Takes metoprolol 12.5 BID at home, was supposed to be on ACE-i per outside records, pt not taking - Continue metoprolol 12.5  BID - Hold ACE-i in setting of rising Cr   4) T2DM  On lantus 30U BID at home. Takes blood glucose erratically, cannot remember values at home. Glucose on Bmet 218. HbA1c is 9.2, she will need better control; however this is down from 12.2 from her outside records. Has PCP here, will recommend evaluating regimen on follow-up.  - SSI while inpt   5) Acute on chronic renal failure, stage 3  Pt's GFR is 48. This is improved from her visit 3 days ago, when  GFR was 40. She says she has been told that she needs to be "careful" with her kidneys, but does not know of disease or diagnosis. Could be related to diabetic disease. Cr in June 2013 was also elevated (1.4) per outside records. Cr increasing in response to diuresis (1.9 today). Will hold Lasix, ACE-i and d/c bactrim for UTI in setting of acute renal failure. Pt to have follow-up Bmet as outpatient  5) UTI Urine shows E. Cloacae, on bactrim for 2 days, but with worsening renal function, will switch to cipro on discharge, to which it is sensitive.   6) Possible obstructive pulmonary disease  Pt reports dx COPD but no PFTs in the past, not currently on inhaler therapy, no lung hyperinflation/emphesematous changes on radiography, and no wheezing. She has only smoked for a few years.  - She may need PFTs as an outpatient at some point if she develops sx of COPD    LOS: 3 days   Bronson Curb 10/04/2012, 9:17 AM

## 2012-10-04 NOTE — Discharge Summary (Signed)
Internal Medicine Teaching [facility] Discharge Note  Name: [name] [name] MRN: [id number] DOB: June 23, 1941 71 y.o.  Date of Admission: 10/01/2012 10:07 AM Date of Discharge: 10/04/2012 Attending Physician: Farley Ly, MD  Discharge Diagnosis: Acute on Chronic CHF exacerbation, systolic dysfunction History of cardiac arrest s/p dual chamber pacemaker and defibrillator placement Hypertension Type 2 Diabetes Mellitus Acute on Chronic Renal Failure, stage 3 Urinary tract infection  Discharge Medications:   Medication List     As of 10/04/2012 11:32 AM    STOP taking these medications         furosemide 40 MG tablet   Commonly known as: LASIX      TAKE these medications         amitriptyline 10 MG tablet   Commonly known as: ELAVIL   Take 10 mg by mouth at bedtime.      atorvastatin 20 MG tablet   Commonly known as: LIPITOR   Take 20 mg by mouth daily.      ciprofloxacin 250 MG tablet   Commonly known as: CIPRO   Take 1 tablet (250 mg total) by mouth 2 (two) times daily. Take for 3 days      insulin glargine 100 UNIT/ML injection   Commonly known as: LANTUS   Inject 30 Units into the skin 2 (two) times daily.      metoprolol tartrate 25 MG tablet   Commonly known as: LOPRESSOR   Take 12.5 mg by mouth 2 (two) times daily.         Disposition and follow-up:   [name] [name] was discharged from [facility] in fair condition.  At the hospital follow up visit please address: 1) Systolic heart failure  Patient w history of systolic heart failure w severely reduced LV function (EF 20% in 04/2012). During this admission EF 25%. Diuresed with lasix. Instructed to stop home lasix and resume on 10/06/12 due to rising Cr w diuresis. Was not on ACE-i therapy when she presented, but we started lisinopril here. We held it when Cr bumped up to 1.9 and held on discharge. Please reassess appropriateness of ACE-i therapy. 2) Acute on chronic renal  failure Creatine bumped 1.1--> 1.9 in setting of diuresis. Held lasix on last day of hospitalization. Please check metabolic panel to assess for recovery of renal function. 3) UTI Pt w E cloacae UTI, broad sensitivities. Bactrim started, held in setting of ARF. Switched to cipro on discharge to complete course with 3 more days therapy. Please assess for resolution of symptoms. 4) ? Obstructive lung disease Pt reports dx of COPD, but no heavy smoking, no PFTs, not on inhaler therapy. Consider PFTs if clinical suspicion COPD.   Follow-up Appointments: Follow-up Information    Follow up with Wilburt Finlay, PA. (Monday November 18th at 320PM)    Contact information:   [facility] [address] [place] [address] [phone]         Discharge Orders    Future Orders Please Complete By Expires   Diet - low sodium heart healthy      Increase activity slowly      Discharge instructions      Comments:   1. Do NOT take any lasix today or tomorrow. You should start back taking your home dose of 40mg  daily on Friday. 2. Take ciprofloxacin 1 tablet twice daily for urinary tract infection for 3 days. Please let your doctor know if your symptoms do not improve. 3. Please  follow up with the heart doctor at [facility] next week as scheduled.   Call MD for:  difficulty breathing, headache or visual disturbances      Call MD for:  persistant nausea and vomiting      (HEART FAILURE PATIENTS) Call MD:  Anytime you have any of the following symptoms: 1) 3 pound weight gain in 24 hours or 5 pounds in 1 week 2) shortness of breath, with or without a dry hacking cough 3) swelling in the hands, feet or stomach 4) if you have to sleep on extra pillows at night in order to breathe.      Call MD for:  persistant dizziness or light-headedness         Consultations:  Dr. Herbie Baltimore, Cardiology, [facility]  Procedures Performed:  Dg Chest 2  View 10/01/2012  *RADIOLOGY REPORT*  Clinical Data: Weakness, shortness of breath  CHEST - 2 VIEW  Comparison: 09/28/2012  Findings: Cardiomegaly again noted.  Dual lead cardiac pacemaker is unchanged in position.  Mild perihilar and infrahilar interstitial prominence.  Mild interstitial edema cannot be excluded.  Small bilateral pleural effusion with basilar atelectasis.  IMPRESSION: Dual lead cardiac pacemaker is unchanged in position.  Mild perihilar and infrahilar interstitial prominence.  Mild interstitial edema cannot be excluded.  Small bilateral pleural effusion with basilar atelectasis.   Original Report Authenticated By: Natasha Mead, M.D.    Dg Chest 2 View 09/28/2012  *RADIOLOGY REPORT*  Clinical Data: Generalized weakness.  Prior history of CHF. Current history of hypertension and diabetes.  CHEST - 2 VIEW  Comparison: None.  Findings: Cardiac silhouette mildly enlarged.  Left subclavian dual lead transvenous pacemaker with dual lead tips at the expected location of the right atrial appendage and right ventricular apex. Thoracic aorta atherosclerotic.  Hilar and mediastinal contours otherwise unremarkable.  Mild diffuse interstitial pulmonary edema. Small bilateral pleural effusions.  Mild degenerative changes involving the thoracic spine.  Old healed fracture involving the right humeral neck.  IMPRESSION: Mild CHF, with mild cardiomegaly and mild diffuse interstitial pulmonary edema.  Small bilateral pleural effusions.   Original Report Authenticated By: Hulan Saas, M.D.     2D Echo 10/02/12:  Study Conclusions - Left ventricle: The cavity size was mildly dilated. Systolic function was severely reduced. The estimated ejection fraction was in the range of 25% to 30%. There is akinesis of the entireanteroseptal myocardium. - Mitral valve: Calcified annulus. Moderate to severe regurgitation. - Left atrium: The atrium was mildly dilated. - Right ventricle: The cavity size was mildly dilated.  Wall thickness was normal. - Atrial septum: No defect or patent foramen ovale was identified. - Tricuspid valve: Moderate regurgitation. - Pulmonary arteries: PA peak pressure: 55mm Hg (S).  - The right ventricular systolic pressure was increased consistent with moderate pulmonary hypertension.  Admission HPI:  [name] [name] is a 71 year old female with past medical history of heart failure with unknown baseline ejection fraction, cardiac arrest s/p dual chamber pacemaker (2006), hypertension, type 2 diabetes, and hyperlipidemia presenting with subacute worsening of shortness of breath.  Patient reports that over the past month she is noticed increasing shortness of breath with exertion, fatigue, and worsening of her bilateral lower extremity edema. She says that these symptoms have become more pronounced over the past 5-7 days. She cannot walk to the mailbox and back without becoming short of breath. She also notices increasing shortness of breath when she has to carry on conversations. She has mild SOB at rest.  She sleeps on 2 pillows at night and awoke last night with orthopnea. She reports compliance with home lasix dose of 40mg  daily. She denies chest pain/pressure or palpitations. She says she has a chronic cough over the past month which is sometimes productive of green/yellow sputum.  On 11/7, she presented to the [facility] ED with similar complaints. She was scheduled follow-up with Dr. Alanson Puls (cardiology) for 10/05/12 and discharged from the ED. She says that her symptoms have not improved which is why she is presenting today.  She denies fever, chills, weight change, HA, acute visual changes, abdominal pain, nausea, vomiting, diarrhea, constipation, hematochezia, melena, dysuria, urinary frequency, change in color of urine, dizziness, weakness. She has chronic "burning" of bilateral feet for which she takes amitriptyline.   Hospital Course by problem list:  1) Acute on chronic CHF  exacerbation  Pt presented with gradual worsening of SOB, LE edema in setting of heart failure. Patient cannot remember many details about diagnosis, says that she has been prescribed several medications in the past, but is now only taking metoprolol and lasix (40mg ) daily. Per outside records, in 6/13 w EF 20%, global hypokinesis, pt intermittently compliant with medications. CXR demonstrated mild interstitial edema and small b/l pleural effusions. Her pro-BNP was 6579, slightly less than the value 4 days prior (1610) when she presented to the ED w similar symptoms. Reassuringly, no acute EKG changes, troponin negative x3.  Echo was performed here 10/02/12 which showed EF 25%-30% with akinesis of the entireanteroseptal myocardium. Cardiology evaluated her [facility], Dr. Herbie Baltimore), recommend outpatient follow-up for management of cardiomyopathy.  Patient received 80mg  IV lasix in ED and then additional 2 days of IV Lasix 80mg . Her LE edema and SOB improved throughout her stay. Admission weight decreased 7lbs, urinary output not fully recorded, at least down 1.5L. Cr jumped from 1.1-1.9 in setting of diuresis, her baseline appears to be 1.3-1.4 from prior records. She has an appointment for 10/09/12 with [facility].   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 Per records, cause is unclear, it seems she has istory of ischemic CM.  No chest pain or palpitations, has not noticed any strange rhythms of her heart. By EKG it looks like she continues to be atrial sensing, ventricular pacing.  Records from prior hospital admission in [facility] were requested and will not arrive until next week.    3) HTN  Patient was normotensive to mildy hypertensive during admission Takes metoprolol 12.5 BID at home, was supposed to be on ACE-i per outside records, pt not  taking. Started back lisinopril for 2 days, but did not give on last day due to rising creatinine. Discharged on home metoprolol, recommend asking about restarting ACE-i at her appointment Monday.   4) T2DM  On lantus 30U BID at home. Takes blood glucose erratically, cannot remember values at home. Glucoses range 100-150. HbA1c is 9.2, she will need better control; however this is down from 12.2 from her outside records. Has PCP here, will recommend evaluating regimen on follow-up. She was provided sliding scale insulin while inpatient with no episodes of hypoglycemia.   5) Likely chronic renal failure, stage 3  Pt's GFR on admission was 48. Cr in June 2013 was also elevated (1.4) per outside records. May be related to diabetes vs HTN, cause unclear. Cr increased in response to diuresis (1.9 day  of discharge). Will hold Lasix, ACE-i and d/c bactrim for UTI in setting of acute renal failure. Pt to have follow-up Bmet as outpatient. Per cardiology, do not restart home lasix until 11.15.13.  5) UTI  Urine shows E. Cloacae, on bactrim for 2 days, but with worsening renal function, will switch to cipro on discharge, to which it is sensitive. Will complete 3 more days of abx therapy   6) Possible obstructive pulmonary disease  Pt reports dx COPD but no PFTs in the past, not currently on inhaler therapy, no lung hyperinflation/emphesematous changes on radiography, and no wheezing. She has only smoked for a few years.  - She may need PFTs as an outpatient at some point if she develops sx of COPD   Discharge Vitals:  BP 149/74  Pulse 83  Temp 97.5 F (36.4 C) (Oral)  Resp 17  Ht 5\' 3"  (1.6 m)  Wt 146 lb 8 oz (66.452 kg)  BMI 25.95 kg/m2  SpO2 89%  Discharge Day PE General: sitting up in her chair  HEENT: PERRL, EOMI, MMM of OP, absent dentition, caries  Cardiac: RRR, no rubs, murmurs or gallops  Pulm: Satting 95% on room air, breathing comfortably. No wheezes or rales. Good air movement.    Abd: soft, nontender, nondistended, BS present  Ext: Only trace edema today. Painless  Neuro: alert and oriented X3, cranial nerves II-XII grossly intact, strength and sensation to light touch equal in bilateral upper and lower extremities  Discharge Labs:  Results for orders placed during the hospital encounter of 10/01/12 (from the past 24 hour(s))  GLUCOSE, CAPILLARY     Status: Abnormal   Collection Time   10/03/12  4:32 PM      Component Value Range   Glucose-Capillary 104 (*) 70 - 99 mg/dL   Comment 1 Notify RN    GLUCOSE, CAPILLARY     Status: Abnormal   Collection Time   10/03/12  9:04 PM      Component Value Range   Glucose-Capillary 156 (*) 70 - 99 mg/dL   Comment 1 Notify RN     Comment 2 Documented in Chart    BASIC METABOLIC PANEL     Status: Abnormal   Collection Time   10/04/12  5:18 AM      Component Value Range   Sodium 137  135 - 145 mEq/L   Potassium 4.2  3.5 - 5.1 mEq/L   Chloride 102  96 - 112 mEq/L   CO2 26  19 - 32 mEq/L   Glucose, Bld 86  70 - 99 mg/dL   BUN 27 (*) 6 - 23 mg/dL   Creatinine, Ser 1.61 (*) 0.50 - 1.10 mg/dL   Calcium 9.0  8.4 - 09.6 mg/dL   GFR calc non Af Amer 24 (*) >90 mL/min   GFR calc Af Amer 28 (*) >90 mL/min  GLUCOSE, CAPILLARY     Status: Normal   Collection Time   10/04/12  6:16 AM      Component Value Range   Glucose-Capillary 87  70 - 99 mg/dL   Comment 1 Notify RN     Comment 2 Documented in Chart    GLUCOSE, CAPILLARY     Status: Abnormal   Collection Time   10/04/12 11:18 AM      Component Value Range   Glucose-Capillary 113 (*) 70 - 99 mg/dL    Signed: Bronson Curb 10/04/2012, 11:32 AM   Time Spent on Discharge: 40 min Services Ordered on  Discharge: Home health heart failure Equipment Ordered on Discharge: none

## 2012-10-04 NOTE — Progress Notes (Signed)
10/04/12 1145 In to speak with pt. about choice for [facility].  Pt. did not want HH services.  York Spaniel "too many people wanting of me, going here and there."  Offered CSW for [facility] to assist with resources in community.  Pt. said she did not want any help.  This NCM called dtr, Suzette Battiest [phone]) and left vm to return call about [facility] for pt.   Tera Mater, RN, BSN Nurse Case Manager [phone]

## 2012-10-10 ENCOUNTER — Other Ambulatory Visit (HOSPITAL_COMMUNITY): Payer: Self-pay | Admitting: Physician Assistant

## 2012-10-10 DIAGNOSIS — I509 Heart failure, unspecified: Secondary | ICD-10-CM

## 2012-10-10 DIAGNOSIS — R0602 Shortness of breath: Secondary | ICD-10-CM

## 2012-10-10 LAB — PULMONARY FUNCTION TEST

## 2012-10-12 ENCOUNTER — Encounter (HOSPITAL_COMMUNITY): Payer: Medicare Other

## 2012-11-19 ENCOUNTER — Encounter (HOSPITAL_BASED_OUTPATIENT_CLINIC_OR_DEPARTMENT_OTHER): Payer: Self-pay | Admitting: *Deleted

## 2012-11-19 ENCOUNTER — Inpatient Hospital Stay (HOSPITAL_BASED_OUTPATIENT_CLINIC_OR_DEPARTMENT_OTHER)
Admission: EM | Admit: 2012-11-19 | Discharge: 2012-11-22 | DRG: 292 | Disposition: A | Discharge status: Home / Self Care | Payer: Medicare Other | Attending: Cardiovascular Disease | Admitting: Cardiovascular Disease

## 2012-11-19 ENCOUNTER — Emergency Department (HOSPITAL_BASED_OUTPATIENT_CLINIC_OR_DEPARTMENT_OTHER): Payer: Medicare Other

## 2012-11-19 DIAGNOSIS — Z885 Allergy status to narcotic agent status: Secondary | ICD-10-CM

## 2012-11-19 DIAGNOSIS — Z950 Presence of cardiac pacemaker: Secondary | ICD-10-CM

## 2012-11-19 DIAGNOSIS — Z79899 Other long term (current) drug therapy: Secondary | ICD-10-CM

## 2012-11-19 DIAGNOSIS — Z87891 Personal history of nicotine dependence: Secondary | ICD-10-CM

## 2012-11-19 DIAGNOSIS — E785 Hyperlipidemia, unspecified: Secondary | ICD-10-CM | POA: Diagnosis present

## 2012-11-19 DIAGNOSIS — Z8674 Personal history of sudden cardiac arrest: Secondary | ICD-10-CM

## 2012-11-19 DIAGNOSIS — I442 Atrioventricular block, complete: Secondary | ICD-10-CM | POA: Diagnosis present

## 2012-11-19 DIAGNOSIS — I429 Cardiomyopathy, unspecified: Secondary | ICD-10-CM | POA: Diagnosis present

## 2012-11-19 DIAGNOSIS — N390 Urinary tract infection, site not specified: Secondary | ICD-10-CM

## 2012-11-19 DIAGNOSIS — I10 Essential (primary) hypertension: Secondary | ICD-10-CM | POA: Diagnosis present

## 2012-11-19 DIAGNOSIS — E119 Type 2 diabetes mellitus without complications: Secondary | ICD-10-CM | POA: Diagnosis present

## 2012-11-19 DIAGNOSIS — I129 Hypertensive chronic kidney disease with stage 1 through stage 4 chronic kidney disease, or unspecified chronic kidney disease: Secondary | ICD-10-CM | POA: Diagnosis present

## 2012-11-19 DIAGNOSIS — N183 Chronic kidney disease, stage 3 unspecified: Secondary | ICD-10-CM | POA: Diagnosis present

## 2012-11-19 DIAGNOSIS — N189 Chronic kidney disease, unspecified: Secondary | ICD-10-CM | POA: Diagnosis present

## 2012-11-19 DIAGNOSIS — I5023 Acute on chronic systolic (congestive) heart failure: Principal | ICD-10-CM | POA: Diagnosis present

## 2012-11-19 DIAGNOSIS — R945 Abnormal results of liver function studies: Secondary | ICD-10-CM | POA: Diagnosis present

## 2012-11-19 DIAGNOSIS — I428 Other cardiomyopathies: Secondary | ICD-10-CM | POA: Diagnosis present

## 2012-11-19 DIAGNOSIS — R748 Abnormal levels of other serum enzymes: Secondary | ICD-10-CM | POA: Diagnosis present

## 2012-11-19 DIAGNOSIS — IMO0001 Reserved for inherently not codable concepts without codable children: Secondary | ICD-10-CM | POA: Diagnosis present

## 2012-11-19 DIAGNOSIS — I509 Heart failure, unspecified: Secondary | ICD-10-CM | POA: Diagnosis present

## 2012-11-19 DIAGNOSIS — N289 Disorder of kidney and ureter, unspecified: Secondary | ICD-10-CM | POA: Diagnosis present

## 2012-11-19 HISTORY — DX: Acute myocardial infarction, unspecified: I21.9

## 2012-11-19 HISTORY — DX: Unspecified osteoarthritis, unspecified site: M19.90

## 2012-11-19 HISTORY — DX: Depression, unspecified: F32.A

## 2012-11-19 HISTORY — DX: Chronic kidney disease, stage 3 (moderate): N18.3

## 2012-11-19 HISTORY — DX: Major depressive disorder, single episode, unspecified: F32.9

## 2012-11-19 HISTORY — DX: Type 2 diabetes mellitus without complications: E11.9

## 2012-11-19 HISTORY — DX: Other cardiomyopathies: I42.8

## 2012-11-19 HISTORY — DX: Presence of cardiac pacemaker: Z95.0

## 2012-11-19 HISTORY — DX: Shortness of breath: R06.02

## 2012-11-19 HISTORY — DX: Presence of automatic (implantable) cardiac defibrillator: Z95.810

## 2012-11-19 HISTORY — DX: Atrioventricular block, complete: I44.2

## 2012-11-19 LAB — CBC
HCT: 38.3 % (ref 36.0–46.0)
MCHC: 33.2 g/dL (ref 30.0–36.0)
RDW: 15.9 % — ABNORMAL HIGH (ref 11.5–15.5)
WBC: 5.6 10*3/uL (ref 4.0–10.5)

## 2012-11-19 LAB — COMPREHENSIVE METABOLIC PANEL
CO2: 27 mEq/L (ref 19–32)
Calcium: 9.1 mg/dL (ref 8.4–10.5)
Creatinine, Ser: 1.3 mg/dL — ABNORMAL HIGH (ref 0.50–1.10)
GFR calc Af Amer: 46 mL/min — ABNORMAL LOW (ref >90)
GFR calc non Af Amer: 40 mL/min — ABNORMAL LOW (ref >90)
Glucose, Bld: 160 mg/dL — ABNORMAL HIGH (ref 70–99)
Potassium: 3.7 mEq/L (ref 3.5–5.1)
Sodium: 137 mEq/L (ref 135–145)

## 2012-11-19 LAB — PRO B NATRIURETIC PEPTIDE: Pro B Natriuretic peptide (BNP): 7378 pg/mL — ABNORMAL HIGH (ref 0–125)

## 2012-11-19 LAB — GLUCOSE, CAPILLARY
Glucose-Capillary: 113 mg/dL — ABNORMAL HIGH (ref 70–99)
Glucose-Capillary: 133 mg/dL — ABNORMAL HIGH (ref 70–99)

## 2012-11-19 LAB — URINE MICROSCOPIC-ADD ON

## 2012-11-19 LAB — URINALYSIS, ROUTINE W REFLEX MICROSCOPIC
Leukocytes, UA: NEGATIVE
Protein, ur: >300 mg/dL — AB
Urobilinogen, UA: 1 mg/dL (ref 0.0–1.0)

## 2012-11-19 LAB — TROPONIN I: Troponin I: <0.3 ng/mL (ref <0.30)

## 2012-11-19 MED ORDER — INSULIN ASPART 100 UNIT/ML ~~LOC~~ SOLN
0.0000 [IU] | Freq: Three times a day (TID) | SUBCUTANEOUS | Status: DC
  Administered 2012-11-21: 5 [IU] via SUBCUTANEOUS
  Administered 2012-11-22: 2 [IU] via SUBCUTANEOUS

## 2012-11-19 MED ORDER — METOPROLOL TARTRATE 25 MG PO TABS
25.0000 mg | ORAL_TABLET | Freq: Two times a day (BID) | ORAL | Status: DC
  Administered 2012-11-19 – 2012-11-22 (×6): 25 mg via ORAL
  Filled 2012-11-19 (×8): qty 1

## 2012-11-19 MED ORDER — CIPROFLOXACIN HCL 500 MG PO TABS
500.0000 mg | ORAL_TABLET | Freq: Two times a day (BID) | ORAL | Status: DC
  Administered 2012-11-19 – 2012-11-22 (×6): 500 mg via ORAL
  Filled 2012-11-19 (×10): qty 1

## 2012-11-19 MED ORDER — INSULIN GLARGINE 100 UNIT/ML ~~LOC~~ SOLN
30.0000 [IU] | Freq: Two times a day (BID) | SUBCUTANEOUS | Status: DC
  Administered 2012-11-19 – 2012-11-22 (×6): 30 [IU] via SUBCUTANEOUS

## 2012-11-19 MED ORDER — SODIUM CHLORIDE 0.9 % IV SOLN: 250.0000 mL | INTRAVENOUS | Status: DC | PRN

## 2012-11-19 MED ORDER — HYDRALAZINE HCL 25 MG PO TABS
37.5000 mg | ORAL_TABLET | Freq: Three times a day (TID) | ORAL | Status: DC
  Administered 2012-11-19 – 2012-11-22 (×7): 37.5 mg via ORAL
  Filled 2012-11-19 (×11): qty 1.5

## 2012-11-19 MED ORDER — SODIUM CHLORIDE 0.9 % IJ SOLN
3.0000 mL | Freq: Two times a day (BID) | INTRAMUSCULAR | Status: DC
  Administered 2012-11-19 – 2012-11-22 (×6): 3 mL via INTRAVENOUS

## 2012-11-19 MED ORDER — SODIUM CHLORIDE 0.9 % IJ SOLN: 3.0000 mL | INTRAMUSCULAR | Status: DC | PRN

## 2012-11-19 MED ORDER — ONDANSETRON HCL 4 MG/2ML IJ SOLN: 4.0000 mg | Freq: Four times a day (QID) | INTRAMUSCULAR | Status: DC | PRN

## 2012-11-19 MED ORDER — ACETAMINOPHEN 325 MG PO TABS: 650.0000 mg | ORAL_TABLET | ORAL | Status: DC | PRN

## 2012-11-19 MED ORDER — REGADENOSON 0.4 MG/5ML IV SOLN
0.4000 mg | Freq: Once | INTRAVENOUS | Status: AC
  Administered 2012-11-20: 0.4 mg via INTRAVENOUS
  Filled 2012-11-19: qty 5

## 2012-11-19 MED ORDER — POTASSIUM CHLORIDE CRYS ER 20 MEQ PO TBCR
20.0000 meq | EXTENDED_RELEASE_TABLET | Freq: Every day | ORAL | Status: DC
  Administered 2012-11-19 – 2012-11-22 (×4): 20 meq via ORAL
  Filled 2012-11-19 (×4): qty 1

## 2012-11-19 MED ORDER — ATORVASTATIN CALCIUM 20 MG PO TABS
20.0000 mg | ORAL_TABLET | Freq: Every day | ORAL | Status: DC
  Administered 2012-11-19 – 2012-11-20 (×2): 20 mg via ORAL
  Filled 2012-11-19 (×2): qty 1

## 2012-11-19 MED ORDER — CIPROFLOXACIN IN D5W 400 MG/200ML IV SOLN
400.0000 mg | Freq: Once | INTRAVENOUS | Status: AC
  Administered 2012-11-19: 400 mg via INTRAVENOUS
  Filled 2012-11-19: qty 200

## 2012-11-19 MED ORDER — AMITRIPTYLINE HCL 10 MG PO TABS
10.0000 mg | ORAL_TABLET | Freq: Every day | ORAL | Status: DC
  Administered 2012-11-19 – 2012-11-21 (×3): 10 mg via ORAL
  Filled 2012-11-19 (×4): qty 1

## 2012-11-19 MED ORDER — ALBUTEROL SULFATE HFA 108 (90 BASE) MCG/ACT IN AERS
2.0000 | INHALATION_SPRAY | Freq: Four times a day (QID) | RESPIRATORY_TRACT | Status: DC | PRN
  Filled 2012-11-19: qty 6.7

## 2012-11-19 MED ORDER — FUROSEMIDE 10 MG/ML IJ SOLN
40.0000 mg | Freq: Two times a day (BID) | INTRAMUSCULAR | Status: DC
  Administered 2012-11-20 – 2012-11-22 (×4): 40 mg via INTRAVENOUS
  Filled 2012-11-19 (×7): qty 4

## 2012-11-19 MED ORDER — FUROSEMIDE 10 MG/ML IJ SOLN
40.0000 mg | Freq: Once | INTRAMUSCULAR | Status: AC
  Administered 2012-11-19: 40 mg via INTRAVENOUS
  Filled 2012-11-19: qty 4

## 2012-11-19 NOTE — ED Notes (Signed)
SOB. Denies CP.

## 2012-11-19 NOTE — ED Provider Notes (Signed)
History     CSN: [id number]  Arrival date & time 11/19/12  1133   First MD Initiated Contact with Patient 11/19/12 1203      Chief Complaint  Patient presents with  . Shortness of Breath    (Consider location/radiation/quality/duration/timing/severity/associated sxs/prior treatment) Patient is a 71 y.o. female presenting with shortness of breath. The history is provided by the patient. No language interpreter was used.  Shortness of Breath  The current episode started 2 days ago. The onset was gradual. The problem has been gradually worsening. The problem is moderate. Nothing relieves the symptoms. Associated symptoms include orthopnea, cough and shortness of breath. Pertinent negatives include no chest pain, no chest pressure, no fever, no rhinorrhea, no sore throat and no wheezing. She has not inhaled smoke recently. She has had prior hospitalizations. She has been behaving normally.   71 year old female with complaint of worsening dyspnea, orthopnea, bilateral lower extremity edema. Past medical history of congestive heart failure, hypertension, diabetes, cardiac arrest, renal failure stage III, UTI and COPD. Recent admission in November for the same. Patient has not missed any of her medications except for her insulin this morning. She also has a dual pacemaker. States that the shortness of breath is with exertion and lying down. Patient lives alone. She denies chest pain. She was me to call her daughter at [employer] and tell her she's here at this hospital.   Past Medical History  Diagnosis Date  . Diabetes mellitus   . Hypertension   . CHF (congestive heart failure)   . 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 Hyperlipidemia   . Chronic renal insufficiency, stage III (moderate)     Etiology unknown    Past Surgical History  Procedure Date  . Pacemaker insertion     Dual chamber pacemaker, 2006, for secondary  prevention cardiac arrest  . Tubal ligation     Family History  Problem Relation Age of Onset  . Heart disease Brother   . Diabetes Mother   . Diabetes Father     History  Substance Use Topics  . Smoking status: Former Smoker -- 1.0 packs/day for 3 years    Types: Cigarettes  . Smokeless tobacco: Not on file  . Alcohol Use: No     Comment: No current, former heavy use, years since last drink    OB History    Grav Para Term Preterm Abortions TAB SAB Ect Mult Living                  Review of Systems  Constitutional: Negative.  Negative for fever.  HENT: Negative.  Negative for sore throat and rhinorrhea.   Eyes: Negative.   Respiratory: Positive for cough and shortness of breath. Negative for wheezing.   Cardiovascular: Positive for orthopnea and leg swelling. Negative for chest pain and palpitations.  Gastrointestinal: Negative.  Negative for nausea and vomiting.  Neurological: Negative.  Negative for dizziness and weakness.  Psychiatric/Behavioral: Negative.   All other systems reviewed and are negative.    Allergies  Codeine  Home Medications   Current Outpatient Rx  Name  Route  Sig  Dispense  Refill  . ALBUTEROL SULFATE HFA 108 (90 BASE) MCG/ACT IN AERS   Inhalation   Inhale 2 puffs into the lungs every 6 (six) hours as needed.         . FUROSEMIDE 40 MG PO TABS  Oral   Take 40 mg by mouth daily.         Marland Kitchen HYDRALAZINE HCL 25 MG PO TABS   Oral   Take 25 mg by mouth 3 (three) times daily.         Marland Kitchen AMITRIPTYLINE HCL 10 MG PO TABS   Oral   Take 10 mg by mouth at bedtime.         . ATORVASTATIN CALCIUM 20 MG PO TABS   Oral   Take 20 mg by mouth daily.         Marland Kitchen CIPROFLOXACIN HCL 250 MG PO TABS   Oral   Take 1 tablet (250 mg total) by mouth 2 (two) times daily. Take for 3 days   6 tablet   0   . INSULIN GLARGINE 100 UNIT/ML [place] SOLN   Subcutaneous   Inject 30 Units into the skin 2 (two) times daily.         Marland Kitchen METOPROLOL TARTRATE  25 MG PO TABS   Oral   Take 12.5 mg by mouth 2 (two) times daily.            BP 152/85  Pulse 80  Temp 98 F (36.7 C) (Oral)  Resp 22  Ht 5\' 3"  (1.6 m)  Wt 150 lb (68.04 kg)  BMI 26.57 kg/m2  SpO2 95%  Physical Exam  Nursing note and vitals reviewed. Constitutional: She is oriented to person, place, and time. She appears well-developed and well-nourished.  HENT:  Head: Normocephalic.  Right Ear: Tympanic membrane normal.  Left Ear: Tympanic membrane normal.  Nose: Nose normal.  Mouth/Throat: Uvula is midline, oropharynx is clear and moist and mucous membranes are normal.  Eyes: Conjunctivae normal and EOM are normal. Pupils are equal, round, and reactive to light.  Neck: Normal range of motion. Neck supple.  Cardiovascular: Normal rate, regular rhythm, normal heart sounds and intact distal pulses.  Exam reveals no decreased pulses.   Pulses:      Radial pulses are 2+ on the right side, and 2+ on the left side.       Dorsalis pedis pulses are 2+ on the right side, and 2+ on the left side.       Left subclavian dual pacemaker. Positive JVD  Pulmonary/Chest: Effort normal. No respiratory distress. She has no wheezes. She has no rales.       Decreased in the bases  Abdominal: Soft. Bowel sounds are normal. She exhibits no distension. There is no tenderness. There is no rebound.  Musculoskeletal: Normal range of motion. She exhibits edema. She exhibits no tenderness.  Neurological: She is alert and oriented to person, place, and time.  Skin: Skin is warm and dry. No erythema.       2+ bilateral lower extremity edema  Psychiatric: She has a normal mood and affect.    ED Course  Procedures (including critical care time)   Labs Reviewed  PRO B NATRIURETIC PEPTIDE  CBC  COMPREHENSIVE METABOLIC PANEL  TROPONIN I  URINALYSIS, ROUTINE W REFLEX MICROSCOPIC   Dg Chest 2 View  11/19/2012  *RADIOLOGY REPORT*  Clinical Data: SOB  CHEST - 2 VIEW  Comparison: 10/01/2012   Findings: Left chest wall pacer device is noted with leads in the right atrial appendage and right ventricle.  There is mild cardiac enlargement.  Small bilateral pleural effusions are identified. Improvement in interstitial edema.  IMPRESSION:  1.  Interval improvement in interstitial edema pattern.   Original Report Authenticated By:  Signa Kell, M.D.      No diagnosis found.    MDM  71 year old with recurrent CHF and UTI with shortness of breath and lower extremity edema. [facility]. Dr. Tresa Endo the cardiologist will admit her. She received 40 of Lasix in the ER today and 400 mg of Cipro IV for her UTI. Patient is stable and ready for transfer. His BNP was 7378, creatinine 1.3 BUN 31. Liver enzymes elevated denies alcohol. Chest x-ray shows improvement in interstitial edema pattern (reviewed by myself) from her last admit in November.Patient will transfer to come on telemetry bed when bed available.   Labs Reviewed  PRO B NATRIURETIC PEPTIDE - Abnormal; Notable for the following:    Pro B Natriuretic peptide (BNP) 7378.0 (*)     All other components within normal limits  CBC - Abnormal; Notable for the following:    RDW 15.9 (*)     All other components within normal limits  COMPREHENSIVE METABOLIC PANEL - Abnormal; Notable for the following:    Glucose, Bld 160 (*)     BUN 31 (*)     Creatinine, Ser 1.30 (*)     Albumin 3.3 (*)     AST 75 (*)     ALT 97 (*)     Alkaline Phosphatase 150 (*)     GFR calc non Af Amer 40 (*)     GFR calc Af Amer 46 (*)     All other components within normal limits  URINALYSIS, ROUTINE W REFLEX MICROSCOPIC - Abnormal; Notable for the following:    APPearance CLOUDY (*)     Protein, ur >300 (*)     All other components within normal limits  URINE MICROSCOPIC-ADD ON - Abnormal; Notable for the following:    Squamous Epithelial / LPF FEW (*)     Bacteria, UA MANY (*)     All other components within normal  limits  TROPONIN I  URINE CULTURE    Date: 11/19/2012  Rate: 74  Rhythm: paced rhythm  QRS Axis: normal  Intervals: normal  ST/T Wave abnormalities: normal  Conduction Disutrbances:none  Narrative Interpretation:   Old EKG Reviewed: unchanged      Remi Haggard, NP 11/19/12 1613

## 2012-11-19 NOTE — H&P (Signed)
[name] [name] is an 71 y.o. female.   Chief Complaint:  Dyspnea HPI:  Patient is a 71 year old Caucasian female with a history of tobacco abuse, apparently quit 10-20 years ago, diabetes mellitus uncontrolled, hypertension, nonischemic cardiomyopathy, cardiac arrest in 2006, Medtronic permanent pacemaker and ICD implanted as well in 2006, hyperlipidemia, chronic renal insufficiency stage III. The patient has recently moved from [place] and her cardiologist was apparently Dr. Duwayne Heck. She had a 2D echocardiogram performed in June of this year which showed an ejection fraction of 20% or less and she had severe global hypokinesis. The left atrium was mildly dilated, measuring 4.4 cm. The pulmonary systolic pressures were 60-65 mmHg with mild tricuspid regurgitation. There was no indication from her [place] record that she had a prior ischemic workup. Patient was recently hospitalized starting on November 10 with progressive lower extremity edema and shortness of breath. During her hospitalization her BNP was 6579.  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 It is a Medtronic device, atrial lead model number is 5076, the right ventricle lead is 5076 as well. She has had no sustained episodes. The battery status is okay, current magnet rate 85, BOL 85, EOL 65. She has approximately 15 months until ERI. Atrial impedance is 434, sensing P wave 1.4; thresholds, Atrial 0.5 V at 0.4 msec. Right ventricle impedance 576, R wave 8, RV at 0.75 V at 0.4 msec. Her current mode is DDDR, rate is 60, she is A pacing 9.7%, V pacing 100%.  She presents with progressive increase in LEE as well as dyspnea, PND, orthopnea.  She reports breathing better when sleeping in her recliner.  She also reports dysuria but no hematuria.  She denies N, V, CP, ABD pain, dizziness.  She was also scheduled for a [employer] which was apparently not completed.    Past Medical History  Diagnosis Date    . Diabetes mellitus   . Hypertension   . CHF (congestive heart failure)   . 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 Hyperlipidemia   . Chronic renal insufficiency, stage III (moderate)     Etiology unknown    Past Surgical History  Procedure Date  . Pacemaker insertion     Dual chamber pacemaker, 2006, for secondary prevention cardiac arrest  . Tubal ligation     Family History  Problem Relation Age of Onset  . Heart disease Brother   . Diabetes Mother   . Diabetes Father    Social History:  reports that she has quit smoking. Her smoking use included Cigarettes. She has a 3 pack-year smoking history. She does not have any smokeless tobacco history on file. She reports that she does not drink alcohol or use illicit drugs.  Allergies:  Allergies  Allergen Reactions  . Codeine Nausea Only     (Not in a hospital admission)  Results for orders placed during the hospital encounter of 11/19/12 (from the past 48 hour(s))  PRO B NATRIURETIC PEPTIDE     Status: Abnormal   Collection Time   11/19/12 12:00 PM      Component Value Range Comment   Pro B Natriuretic peptide (BNP) 7378.0 (*) 0 - 125 pg/mL   CBC     Status: Abnormal   Collection Time   11/19/12 12:00 PM      Component Value Range Comment   WBC 5.6  4.0 -  10.5 K/uL    RBC 4.69  3.87 - 5.11 MIL/uL    Hemoglobin 12.7  12.0 - 15.0 g/dL    HCT 45.4  09.8 - 11.9 %    MCV 81.7  78.0 - 100.0 fL    MCH 27.1  26.0 - 34.0 pg    MCHC 33.2  30.0 - 36.0 g/dL    RDW 14.7 (*) 82.9 - 15.5 %    Platelets 240  150 - 400 K/uL   COMPREHENSIVE METABOLIC PANEL     Status: Abnormal   Collection Time   11/19/12 12:00 PM      Component Value Range Comment   Sodium 137  135 - 145 mEq/L    Potassium 3.7  3.5 - 5.1 mEq/L    Chloride 100  96 - 112 mEq/L    CO2 27  19 - 32 mEq/L    Glucose, Bld 160 (*) 70 - 99 mg/dL    BUN 31 (*) 6 - 23 mg/dL    Creatinine, Ser 5.62 (*)  0.50 - 1.10 mg/dL    Calcium 9.1  8.4 - 13.0 mg/dL    Total Protein 6.9  6.0 - 8.3 g/dL    Albumin 3.3 (*) 3.5 - 5.2 g/dL    AST 75 (*) 0 - 37 U/L    ALT 97 (*) 0 - 35 U/L    Alkaline Phosphatase 150 (*) 39 - 117 U/L    Total Bilirubin 1.1  0.3 - 1.2 mg/dL    GFR calc non Af Amer 40 (*) >90 mL/min    GFR calc Af Amer 46 (*) >90 mL/min   TROPONIN I     Status: Normal   Collection Time   11/19/12 12:00 PM      Component Value Range Comment   Troponin I <0.30  <0.30 ng/mL   URINALYSIS, ROUTINE W REFLEX MICROSCOPIC     Status: Abnormal   Collection Time   11/19/12  1:13 PM      Component Value Range Comment   Color, Urine YELLOW  YELLOW    APPearance CLOUDY (*) CLEAR    Specific Gravity, Urine 1.019  1.005 - 1.030    pH 6.5  5.0 - 8.0    Glucose, UA NEGATIVE  NEGATIVE mg/dL    Hgb urine dipstick NEGATIVE  NEGATIVE    Bilirubin Urine NEGATIVE  NEGATIVE    Ketones, ur NEGATIVE  NEGATIVE mg/dL    Protein, ur >865 (*) NEGATIVE mg/dL    Urobilinogen, UA 1.0  0.0 - 1.0 mg/dL    Nitrite NEGATIVE  NEGATIVE    Leukocytes, UA NEGATIVE  NEGATIVE   URINE MICROSCOPIC-ADD ON     Status: Abnormal   Collection Time   11/19/12  1:13 PM      Component Value Range Comment   Squamous Epithelial / LPF FEW (*) RARE    WBC, UA 7-10  <3 WBC/hpf    RBC / HPF 0-2  <3 RBC/hpf    Bacteria, UA MANY (*) RARE    Dg Chest 2 View  11/19/2012  *RADIOLOGY REPORT*  Clinical Data: SOB  CHEST - 2 VIEW  Comparison: 10/01/2012  Findings: Left chest wall pacer device is noted with leads in the right atrial appendage and right ventricle.  There is mild cardiac enlargement.  Small bilateral pleural effusions are identified. Improvement in interstitial edema.  IMPRESSION:  1.  Interval improvement in interstitial edema pattern.   Original Report Authenticated By: Signa Kell, M.D.  Review of Systems  Constitutional: Negative for fever and diaphoresis.  HENT: Negative for congestion and sore throat.     Respiratory: Positive for shortness of breath. Negative for cough.   Cardiovascular: Positive for orthopnea, leg swelling and PND. Negative for chest pain and palpitations.  Gastrointestinal: Negative for nausea, vomiting, abdominal pain, diarrhea and blood in stool.  Genitourinary: Positive for dysuria. Negative for hematuria.  Neurological: Negative for dizziness.    Blood pressure 150/73, pulse 80, temperature 98 F (36.7 C), temperature source Oral, resp. rate 20, height 5\' 3"  (1.6 m), weight 68.04 kg (150 lb), SpO2 97.00%. Physical Exam  Constitutional: She is oriented to person, place, and time. She appears well-developed and well-nourished. No distress.  HENT:  Head: Normocephalic and atraumatic.  Eyes: Conjunctivae normal are normal. Pupils are equal, round, and reactive to light. No scleral icterus.  Neck: Normal range of motion. JVD present.  Cardiovascular: Normal rate, regular rhythm, S1 normal and S2 normal.   No murmur heard. Pulses:      Radial pulses are 2+ on the right side, and 2+ on the left side.       Dorsalis pedis pulses are 2+ on the right side, and 2+ on the left side.  Respiratory: Effort normal and breath sounds normal. She has no wheezes. She has no rales.  GI: Soft. Bowel sounds are normal. She exhibits no distension. There is no tenderness.  Musculoskeletal: She exhibits edema.       2+ LEE   Neurological: She is alert and oriented to person, place, and time.  Skin: Skin is warm and dry.  Psychiatric: She has a normal mood and affect.     Assessment/Plan Patient Active Hospital Problem List: Acute on chronic systolic CHF (congestive heart failure) (10/01/2012) Hypertension (10/01/2012) S/P placement of cardiac pacemaker, MDT in 2006 (10/01/2012) Hyperlipidemia (10/01/2012) Diabetes mellitus (10/01/2012) Cardiomyopathy, (? ischemic AS AK on echo), EF 25% (10/04/2012) Chronic renal insufficiency (11/19/2012) Elevated liver enzymes  (11/19/2012)  Plan:  She will be admitted and continued on IV lasix and scheduled for a Lexiscan myoview since it was not completed in November.  Monitor LFTs, which are likely elevated 2/2 hepatic congestion.  Continue appropriate home meds.   HAGER, BRYAN 11/19/2012, 3:50 PM   Patient seen and examined. Agree with assessment and plan. Pleasant 71 you WF with h/o cardiomyopathy s/p cardiac arrest in 2006 treated with ICD. EF ~ 25% bt last echo. She has recently moved to [facility] in 05/2012. She has h/o DM, renal insufficiency, CHF and had been admitted in Nov with CHF exacerbation. She now presents with several week history of PND, orthopnea, increasing weight gain and at [facility] ER evaluation today BNP 7378. She was given lasix 40 mg IV and alsop Cipro for UTI and is now admitted for further evaluation and treatment. Suspect elevated LFT are due to passive congestion. Initial troponin is negative. CXR actually shows improvement in interstitial edema from Nov and shows small pleural effusions. Pt never did have her outpatientt myoview.  Will treat with  IV diuresis, increase nitrates/hydralazine, f/u renal function and if stable possible rechallenge with low dose ARB. Plan for inpatient myoview.   Lennette Bihari, MD, [facility] 11/19/2012 5:56 PM

## 2012-11-20 ENCOUNTER — Encounter (HOSPITAL_COMMUNITY): Payer: Self-pay | Admitting: Cardiology

## 2012-11-20 ENCOUNTER — Inpatient Hospital Stay (HOSPITAL_COMMUNITY): Payer: Medicare Other

## 2012-11-20 DIAGNOSIS — N183 Chronic kidney disease, stage 3 unspecified: Secondary | ICD-10-CM

## 2012-11-20 HISTORY — DX: Chronic kidney disease, stage 3 unspecified: N18.30

## 2012-11-20 LAB — GLUCOSE, CAPILLARY
Glucose-Capillary: 140 mg/dL — ABNORMAL HIGH (ref 70–99)
Glucose-Capillary: 161 mg/dL — ABNORMAL HIGH (ref 70–99)
Glucose-Capillary: 166 mg/dL — ABNORMAL HIGH (ref 70–99)

## 2012-11-20 LAB — COMPREHENSIVE METABOLIC PANEL
ALT: 87 U/L — ABNORMAL HIGH (ref 0–35)
CO2: 26 mEq/L (ref 19–32)
Calcium: 9.2 mg/dL (ref 8.4–10.5)
Creatinine, Ser: 1.33 mg/dL — ABNORMAL HIGH (ref 0.50–1.10)
GFR calc Af Amer: 45 mL/min — ABNORMAL LOW (ref >90)
GFR calc non Af Amer: 39 mL/min — ABNORMAL LOW (ref >90)
Glucose, Bld: 86 mg/dL (ref 70–99)
Sodium: 140 mEq/L (ref 135–145)
Total Protein: 6.4 g/dL (ref 6.0–8.3)

## 2012-11-20 MED ORDER — TECHNETIUM TC 99M SESTAMIBI GENERIC - CARDIOLITE
10.0000 | Freq: Once | INTRAVENOUS | Status: AC | PRN
  Administered 2012-11-20: 10 via INTRAVENOUS

## 2012-11-20 MED ORDER — SPIRONOLACTONE 12.5 MG HALF TABLET
12.5000 mg | ORAL_TABLET | Freq: Every day | ORAL | Status: DC
  Administered 2012-11-20 – 2012-11-22 (×3): 12.5 mg via ORAL
  Filled 2012-11-20 (×3): qty 1

## 2012-11-20 MED ORDER — ZOLPIDEM TARTRATE 5 MG PO TABS
5.0000 mg | ORAL_TABLET | Freq: Every evening | ORAL | Status: DC | PRN
  Administered 2012-11-21: 5 mg via ORAL
  Filled 2012-11-20: qty 1

## 2012-11-20 MED ORDER — TECHNETIUM TC 99M SESTAMIBI GENERIC - CARDIOLITE
30.0000 | Freq: Once | INTRAVENOUS | Status: AC | PRN
  Administered 2012-11-20: 30 via INTRAVENOUS

## 2012-11-20 MED ORDER — LOPERAMIDE HCL 2 MG PO CAPS
2.0000 mg | ORAL_CAPSULE | ORAL | Status: DC | PRN
  Administered 2012-11-20: 2 mg via ORAL
  Filled 2012-11-20: qty 1
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
## 2012-11-20 NOTE — Progress Notes (Signed)
Utilization review completed.  

## 2012-11-21 LAB — BASIC METABOLIC PANEL
BUN: 33 mg/dL — ABNORMAL HIGH (ref 6–23)
Chloride: 103 mEq/L (ref 96–112)
GFR calc Af Amer: 31 mL/min — ABNORMAL LOW (ref >90)
Potassium: 3.5 mEq/L (ref 3.5–5.1)
Sodium: 140 mEq/L (ref 135–145)

## 2012-11-21 LAB — GLUCOSE, CAPILLARY: Glucose-Capillary: 183 mg/dL — ABNORMAL HIGH (ref 70–99)

## 2012-11-21 NOTE — Progress Notes (Signed)
[facility]  Subjective: Breathing better.  Still with LEE.  Objective: Vital signs in last 24 hours: Temp:  [97.6 F (36.4 C)-98.4 F (36.9 C)] 98.4 F (36.9 C) (12/31 0502) Pulse Rate:  [70-85] 72  (12/31 0502) Resp:  [18-20] 20  (12/31 0502) BP: (126-168)/(70-90) 126/70 mmHg (12/31 0941) SpO2:  [90 %-98 %] 90 % (12/31 0502) Weight:  [69.446 kg (153 lb 1.6 oz)] 69.446 kg (153 lb 1.6 oz) (12/31 0502) Last BM Date: 11/20/12  Intake/Output from previous day: 12/30 0701 - 12/31 0700 In: 960 [P.O.:960] Out: 2505 [Urine:2500; Stool:5] Intake/Output this shift: Total I/O In: 132 [P.O.:120; IV Piggyback:12] Out: 450 [Urine:450]  Medications Current Facility-Administered Medications  Medication Dose Route Frequency Provider Last Rate Last Dose  . 0.9 %  sodium chloride infusion  250 mL Intravenous PRN Wilburt Finlay, PA      . acetaminophen (TYLENOL) tablet 650 mg  650 mg Oral Q4H PRN Wilburt Finlay, PA      . albuterol (PROVENTIL HFA;VENTOLIN HFA) 108 (90 BASE) MCG/ACT inhaler 2 puff  2 puff Inhalation Q6H PRN Wilburt Finlay, PA      . amitriptyline (ELAVIL) tablet 10 mg  10 mg Oral QHS Wilburt Finlay, PA   10 mg at 11/20/12 2314  . ciprofloxacin (CIPRO) tablet 500 mg  500 mg Oral BID Wilburt Finlay, PA   500 mg at 11/21/12 0808  . furosemide (LASIX) injection 40 mg  40 mg Intravenous Q12H Wilburt Finlay, PA   40 mg at 11/21/12 0808  . hydrALAZINE (APRESOLINE) tablet 37.5 mg  37.5 mg Oral TID Wilburt Finlay, PA   37.5 mg at 11/21/12 0941  . insulin aspart (novoLOG) injection 0-9 Units  0-9 Units Subcutaneous TID WC Wilburt Finlay, PA      . insulin glargine (LANTUS) injection 30 Units  30 Units Subcutaneous BID Wilburt Finlay, PA   30 Units at 11/21/12 0941  . loperamide (IMODIUM) capsule 2 mg  2 mg Oral PRN Lennette Bihari, MD   2 mg at 11/20/12 1608  . metoprolol tartrate (LOPRESSOR) tablet 25 mg  25 mg Oral BID Wilburt Finlay, PA   25 mg at 11/21/12 0941  . ondansetron (ZOFRAN)  injection 4 mg  4 mg Intravenous Q6H PRN Wilburt Finlay, PA      . potassium chloride SA (K-DUR,KLOR-CON) CR tablet 20 mEq  20 mEq Oral Daily Wilburt Finlay, PA   20 mEq at 11/21/12 0941  . sodium chloride 0.9 % injection 3 mL  3 mL Intravenous Q12H Wilburt Finlay, PA   3 mL at 11/21/12 0942  . sodium chloride 0.9 % injection 3 mL  3 mL Intravenous PRN Wilburt Finlay, PA      . spironolactone (ALDACTONE) tablet 12.5 mg  12.5 mg Oral Daily Marykay Lex, MD   12.5 mg at 11/21/12 0942  . zolpidem (AMBIEN) tablet 5 mg  5 mg Oral QHS PRN Marykay Lex, MD        PE: General appearance: alert, cooperative and no distress Lungs: clear to auscultation bilaterally Heart: regular rate and rhythm, S1, S2 normal, no murmur, click, rub or gallop Extremities: 2+ LEE Pulses: 2+ and symmetric  Lab Results:   Basename 11/19/12 1200  WBC 5.6  HGB 12.7  HCT 38.3  PLT 240   BMET  Basename 11/21/12 0602 11/20/12 0710 11/19/12 1200  NA 140 140 137  K 3.5 3.6 3.7  CL 103 102 100  CO2 28 26 27  GLUCOSE 91 86 160*  BUN 33* 31* 31*  CREATININE 1.78* 1.33* 1.30*  CALCIUM 9.0 9.2 9.1   MYOCARDIAL IMAGING WITH SPECT (REST AND STRESS)  Findings: Reduced and to be in the cardiac apex and anteroapical  wall favors scar. There is also reduced activity in the inferior  wall on stress and rest images favoring scarring over diaphragmatic  attenuation.  LEFT VENTRICULAR EJECTION FRACTION  Findings: Left ventricular end-diastolic volume is 192 cc. End-  systolic volume is 140 cc. Derived LV ejection fraction is 23%.  GATED LEFT VENTRICULAR WALL MOTION STUDY  Findings: Global hypokinesis and poor wall thickening noted.  Apical dyskinesis.  IMPRESSION:  1. Global hypokinesis and poor wall thickening, with ejection  fraction of 23%.  2. No inducible ischemia is identified, but there is reduced  activity in the anterior wall, cardiac apex, and inferior wall  favoring scarring. The inferior wall reduced activity  could be  secondary to diaphragmatic attenuation.   Assessment/Plan  Principal Problem:  *Acute on chronic systolic CHF (congestive heart failure) Active Problems:  Hypertension  S/P placement of cardiac pacemaker, MDT in 2006  Hyperlipidemia  Diabetes mellitus  Cardiomyopathy, (? ischemic AS AK on echo), EF 25%  Elevated liver enzymes  CKD (chronic kidney disease) stage 3, GFR 30-59 ml/min  Plan:  Net fluids -3.6L in the last 48 hrs.  Will hold PM Lasix today.  BP and HR stable.  V-Pacing.  BNP decreased to 4600.  Negative ischemia on Nuc.  EF 23%.     LOS: 2 days    HAGER, BRYAN 11/21/2012 10:44 AM   Patient seen and examined. Agree with assessment and plan.  Breathing much improved; mild LE edema persists.  EF 23% on myoview without ischemia. Will dc bid dosing of lasix and just give in am.   Lennette Bihari, MD, [facility] 11/21/2012 10:57 AM

## 2012-11-21 NOTE — ED Provider Notes (Signed)
Medical screening examination/treatment/procedure(s) were conducted as a shared visit with non-physician practitioner(Anne Crawford) and myself.  I personally evaluated the patient during the encounter.  The patient presents with shortness or breath and increased leg swelling for the past several days.  She has a history of chf and has been admitted recently for the same.  She recently relocated here from out of town and is yet to establish primary care.  She denies chest pain.  The symptoms are worse with exertion and lying flat.  On exam, the patient is afebrile and the vitals are stable.  The heart is regular rate and rhythm and the lungs are noted to have rales in the bases.  The abdomen is benign.  The lower extremities are noted to have 2+ edema.  Brief neurological exam is non-focal.    The chest xray shows cardiomegaly with increased vascular markings and the bnp is markedly elevated.  She was given iv lasix.  The remainder of the labs were essentially unremarkable and the ekg reveals no acute process.  She becomes dyspneic with exertion and does not feel as though she is any shape to go home.  Thurston Hole has spoken with [facility] who has agreed to accept the patient in transfer.    Geoffery Lyons, MD 11/21/12 [phone]

## 2012-11-22 LAB — URINE CULTURE: Colony Count: >=100000

## 2012-11-22 LAB — BASIC METABOLIC PANEL
BUN: 34 mg/dL — ABNORMAL HIGH (ref 6–23)
CO2: 25 mEq/L (ref 19–32)
Chloride: 103 mEq/L (ref 96–112)
Creatinine, Ser: 1.61 mg/dL — ABNORMAL HIGH (ref 0.50–1.10)

## 2012-11-22 LAB — GLUCOSE, CAPILLARY: Glucose-Capillary: 92 mg/dL (ref 70–99)

## 2012-11-22 MED ORDER — SPIRONOLACTONE 12.5 MG HALF TABLET: 12.5000 mg | ORAL_TABLET | Freq: Every day | ORAL | Status: DC

## 2012-11-22 MED ORDER — CIPROFLOXACIN HCL 500 MG PO TABS: 500.0000 mg | ORAL_TABLET | Freq: Two times a day (BID) | ORAL | Status: DC

## 2012-11-22 MED ORDER — HYDRALAZINE HCL 25 MG PO TABS: 37.5000 mg | ORAL_TABLET | Freq: Three times a day (TID) | ORAL | Status: DC

## 2012-11-22 NOTE — Progress Notes (Signed)
D/c orders received;IV removed with gauze on, pt remains in stable condition, pt meds and instructions reviewed and given to pt; reminded pt importance of taking meds as prescribed and to weigh self daily; pt d/c to home

## 2012-11-22 NOTE — Progress Notes (Signed)
[facility]  Subjective: [name] [name] improved.  Breathing better  Objective: Vital signs in last 24 hours: Temp:  [97.7 F (36.5 C)-98.5 F (36.9 C)] 98 F (36.7 C) (01/01 0456) Pulse Rate:  [70-76] 76  (01/01 0924) Resp:  [20] 20  (12/31 1355) BP: (137-148)/(68-81) 147/81 mmHg (01/01 0924) SpO2:  [93 %-94 %] 93 % (01/01 0456) Weight:  [72.1 kg (158 lb 15.2 oz)] 72.1 kg (158 lb 15.2 oz) (01/01 0456) Last BM Date: 11/21/12  Intake/Output from previous day: 12/31 0701 - 01/01 0700 In: 364 [P.O.:352; IV Piggyback:12] Out: 2250 [Urine:2250] Intake/Output this shift: Total I/O In: 300 [P.O.:300] Out: 700 [Urine:700]  Medications Current Facility-Administered Medications  Medication Dose Route Frequency Provider Last Rate Last Dose  . 0.9 %  sodium chloride infusion  250 mL Intravenous PRN Wilburt Finlay, PA      . acetaminophen (TYLENOL) tablet 650 mg  650 mg Oral Q4H PRN Wilburt Finlay, PA      . albuterol (PROVENTIL HFA;VENTOLIN HFA) 108 (90 BASE) MCG/ACT inhaler 2 puff  2 puff Inhalation Q6H PRN Wilburt Finlay, PA      . amitriptyline (ELAVIL) tablet 10 mg  10 mg Oral QHS Wilburt Finlay, PA   10 mg at 11/21/12 2206  . ciprofloxacin (CIPRO) tablet 500 mg  500 mg Oral BID Wilburt Finlay, PA   500 mg at 11/22/12 0737  . furosemide (LASIX) injection 40 mg  40 mg Intravenous Q12H Wilburt Finlay, PA   40 mg at 11/22/12 0925  . hydrALAZINE (APRESOLINE) tablet 37.5 mg  37.5 mg Oral TID Wilburt Finlay, PA   37.5 mg at 11/22/12 0924  . insulin aspart (novoLOG) injection 0-9 Units  0-9 Units Subcutaneous TID WC Wilburt Finlay, PA   5 Units at 11/21/12 1238  . insulin glargine (LANTUS) injection 30 Units  30 Units Subcutaneous BID Wilburt Finlay, PA   30 Units at 11/22/12 [phone]  . loperamide (IMODIUM) capsule 2 mg  2 mg Oral PRN Lennette Bihari, MD   2 mg at 11/20/12 1608  . metoprolol tartrate (LOPRESSOR) tablet 25 mg  25 mg Oral BID Wilburt Finlay, PA   25 mg at 11/22/12 0924  . ondansetron  (ZOFRAN) injection 4 mg  4 mg Intravenous Q6H PRN Wilburt Finlay, PA      . potassium chloride SA (K-DUR,KLOR-CON) CR tablet 20 mEq  20 mEq Oral Daily Wilburt Finlay, PA   20 mEq at 11/22/12 0924  . sodium chloride 0.9 % injection 3 mL  3 mL Intravenous Q12H Wilburt Finlay, PA   3 mL at 11/22/12 0931  . sodium chloride 0.9 % injection 3 mL  3 mL Intravenous PRN Wilburt Finlay, PA      . spironolactone (ALDACTONE) tablet 12.5 mg  12.5 mg Oral Daily Marykay Lex, MD   12.5 mg at 11/22/12 9604  . zolpidem (AMBIEN) tablet 5 mg  5 mg Oral QHS PRN Marykay Lex, MD   5 mg at 11/21/12 2206    PE: General appearance: alert, cooperative and no distress  Lungs: clear to auscultation bilaterally  Heart: regular rate and rhythm, S1, S2 normal, no murmur, click, rub or gallop  Extremities: 2+ [name] [name]  Pulses: 2+ and symmetric   Lab Results:   Basename 11/19/12 1200  WBC 5.6  HGB 12.7  HCT 38.3  PLT 240   BMET  Basename 11/22/12 0650 11/21/12 0602 11/20/12 0710  NA 138 140 140  K 3.6 3.5 3.6  CL  103 103 102  CO2 25 28 26   GLUCOSE 85 91 86  BUN 34* 33* 31*  CREATININE 1.61* 1.78* 1.33*  CALCIUM 8.9 9.0 9.2    Assessment/Plan  Principal Problem:  *Acute on chronic systolic CHF (congestive heart failure) Active Problems:  Hypertension  S/P placement of cardiac pacemaker, MDT in 2006  Hyperlipidemia  Diabetes mellitus  Cardiomyopathy, (? ischemic AS AK on echo), EF 25%  Elevated liver enzymes  CKD (chronic kidney disease) stage 3, GFR 30-59 ml/min  Plan:  Net Fluids:  -1.9L/-5.5L.  SCr improving.  Lasix now QAM.   BP stable.  Discussed Sodium restriction  And daily weights.  DC home today with OP followup in 7 days.   LOS: 3 days    Callin Ashe 11/22/2012 10:45 AM

## 2012-11-29 NOTE — Discharge Summary (Signed)
Physician Discharge Summary  Patient ID: Zoi Devine MRN: [id number] DOB/AGE: [birth year] 72 y.o.  Admit date: 11/19/2012 Discharge date: 11/29/2012  Admission Diagnoses: Acute on chronic systolic CHF (congestive heart failure)  Discharge Diagnoses:  Principal Problem:  *Acute on chronic systolic CHF (congestive heart failure) Active Problems:  Hypertension  S/P placement of cardiac pacemaker, MDT in 2006  Hyperlipidemia  Diabetes mellitus  Cardiomyopathy, (? ischemic AS AK on echo), EF 25%  Elevated liver enzymes  CKD (chronic kidney disease) stage 3, GFR 30-59 ml/min   Discharged Condition: stable  Hospital Course:   Patient is a 72 year old Caucasian female with a history of tobacco abuse, apparently quit 10-20 years ago,  diabetes mellitus uncontrolled, hypertension, nonischemic cardiomyopathy, cardiac arrest in 2006, Medtronic  permanent pacemaker in 2006, hyperlipidemia, chronic renal insufficiency stage III. The patient has recently moved from [place] and her cardiologist was apparently Dr. Duwayne Heck. She had a 2D echocardiogram performed in June of this year which showed an ejection fraction of 20% or less and she had severe global hypokinesis. The left atrium was mildly dilated, measuring 4.4 cm. The pulmonary systolic pressures were 60-65 mmHg with mild tricuspid regurgitation. There was no indication from her  [place] record that she had a prior ischemic workup. Patient was recently hospitalized starting on November 10 with progressive lower extremity edema and shortness of breath. During her hospitalization her BNP was 6579.   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 It is a Medtronic device, atrial lead model number is 5076, the right ventricle lead is 5076 as well. She has had no sustained episodes. The battery status is okay, current magnet rate 85, BOL 85, EOL 65. She has approximately 15 months until ERI. Atrial impedance is 434, sensing P  wave 1.4; thresholds, Atrial 0.5 V at 0.4 msec. Right ventricle impedance 576, R wave 8, RV at 0.75 V at 0.4 msec. Her current mode is DDDR, rate is 60, she is A pacing 9.7%, V pacing 100%.   She presented with progressive increase in LEE as well as dyspnea, PND, orthopnea. She reported breathing better when sleeping in her recliner. She also reported dysuria but no hematuria. She denied N, V, CP, ABD pain, dizziness. She was also scheduled for a [employer] which was apparently not completed.  She was admitted and started on IV lasix.  Lexiscan myoview was scheduled and completed.  Results revealed and EF of 23% with global hypokinesis and no inducible ischemia.  The patient diuresed quite well with Net total fluids of -5.5L. SCr improved after holding one evening dose of lasix.  A lengthy discussion about sodium restriction and daily weights.  The patient was seen by Dr. Tresa Endo who felt she was stable for DC home.   Consults: None  Significant Diagnostic Studies:  MYOCARDIAL IMAGING WITH SPECT (REST AND STRESS)  Findings: Reduced and to be in the cardiac apex and anteroapical  wall favors scar. There is also reduced activity in the inferior  wall on stress and rest images favoring scarring over diaphragmatic  attenuation.  LEFT VENTRICULAR EJECTION FRACTION  Findings: Left ventricular end-diastolic volume is 192 cc. End-  systolic volume is 140 cc. Derived LV ejection fraction is 23%.  GATED LEFT VENTRICULAR WALL MOTION STUDY  Findings: Global hypokinesis and poor wall thickening noted.  Apical dyskinesis.  IMPRESSION:  1. Global hypokinesis and poor wall thickening, with ejection  fraction of 23%.  2. No inducible ischemia is identified, but there is reduced  activity in the anterior wall, cardiac apex, and inferior wall  favoring scarring. The inferior wall reduced activity could be  secondary to diaphragmatic attenuation.  CHEST - 2 VIEW  Comparison: 10/01/2012  Findings: Left  chest wall pacer device is noted with leads in the  right atrial appendage and right ventricle. There is mild cardiac  enlargement. Small bilateral pleural effusions are identified.  Improvement in interstitial edema.  IMPRESSION:  1. Interval improvement in interstitial edema pattern.  Treatments: IV lasix  Discharge Exam: Blood pressure 147/81, pulse 76, temperature 98 F (36.7 C), temperature source Oral, resp. rate 20, height 5\' 3"  (1.6 m), weight 72.1 kg (158 lb 15.2 oz), SpO2 93.00%.   Disposition: 01-Home or Self Care  Discharge Orders    Future Orders Please Complete By Expires   Diet - low sodium heart healthy      Increase activity slowly          Medication List     As of 11/29/2012  1:44 PM    TAKE these medications         albuterol 108 (90 BASE) MCG/ACT inhaler   Commonly known as: PROVENTIL HFA;VENTOLIN HFA   Inhale 2 puffs into the lungs every 6 (six) hours as needed. For wheezing      atorvastatin 20 MG tablet   Commonly known as: LIPITOR   Take 20 mg by mouth daily.      ciprofloxacin 500 MG tablet   Commonly known as: CIPRO   Take 1 tablet (500 mg total) by mouth 2 (two) times daily.      furosemide 40 MG tablet   Commonly known as: LASIX   Take 40 mg by mouth daily.      hydrALAZINE 25 MG tablet   Commonly known as: APRESOLINE   Take 1.5 tablets (37.5 mg total) by mouth 3 (three) times daily.      insulin glargine 100 UNIT/ML injection   Commonly known as: LANTUS   Inject 30 Units into the skin 2 (two) times daily.      metoprolol tartrate 25 MG tablet   Commonly known as: LOPRESSOR   Take 25 mg by mouth 2 (two) times daily.      naproxen sodium 220 MG tablet   Commonly known as: ANAPROX   Take 440 mg by mouth daily as needed. For pain      spironolactone 12.5 mg Tabs   Commonly known as: ALDACTONE   Take 0.5 tablets (12.5 mg total) by mouth daily.      SYSTANE 0.4-0.3 % Soln   Generic drug: Polyethyl Glycol-Propyl Glycol   Place 1  drop into both eyes daily as needed. For dry eyes           Follow-up Information    Follow up with Elycia Woodside, PA. (Our office will call you with the appt date and time.)    Contact information:   [address] [place] [address] [phone]          Signed: Wilburt Finlay 11/29/2012, 1:44 PM

## 2013-03-15 ENCOUNTER — Encounter: Payer: Self-pay | Admitting: Cardiovascular Disease

## 2013-04-13 ENCOUNTER — Other Ambulatory Visit: Payer: Self-pay | Admitting: *Deleted

## 2013-04-13 MED ORDER — METOPROLOL TARTRATE 25 MG PO TABS: 25.0000 mg | ORAL_TABLET | Freq: Two times a day (BID) | ORAL | Status: DC

## 2013-04-13 NOTE — Telephone Encounter (Signed)
Metoprolol rx printed for Dr Alanda Amass to refill

## 2013-06-27 ENCOUNTER — Other Ambulatory Visit: Payer: Self-pay

## 2013-08-14 ENCOUNTER — Other Ambulatory Visit: Payer: Self-pay | Admitting: *Deleted

## 2013-09-12 ENCOUNTER — Encounter (HOSPITAL_COMMUNITY): Payer: Self-pay | Admitting: Emergency Medicine

## 2013-09-12 ENCOUNTER — Emergency Department (HOSPITAL_COMMUNITY)
Admission: EM | Admit: 2013-09-12 | Discharge: 2013-09-12 | Disposition: A | Discharge status: Home / Self Care | Payer: Medicare Other | Attending: Emergency Medicine | Admitting: Emergency Medicine

## 2013-09-12 ENCOUNTER — Emergency Department (HOSPITAL_COMMUNITY): Payer: Medicare Other

## 2013-09-12 DIAGNOSIS — Z8674 Personal history of sudden cardiac arrest: Secondary | ICD-10-CM | POA: Insufficient documentation

## 2013-09-12 DIAGNOSIS — R0609 Other forms of dyspnea: Secondary | ICD-10-CM | POA: Insufficient documentation

## 2013-09-12 DIAGNOSIS — Z79899 Other long term (current) drug therapy: Secondary | ICD-10-CM | POA: Insufficient documentation

## 2013-09-12 DIAGNOSIS — Z87891 Personal history of nicotine dependence: Secondary | ICD-10-CM | POA: Insufficient documentation

## 2013-09-12 DIAGNOSIS — R0989 Other specified symptoms and signs involving the circulatory and respiratory systems: Secondary | ICD-10-CM | POA: Insufficient documentation

## 2013-09-12 DIAGNOSIS — I509 Heart failure, unspecified: Secondary | ICD-10-CM | POA: Insufficient documentation

## 2013-09-12 DIAGNOSIS — M129 Arthropathy, unspecified: Secondary | ICD-10-CM | POA: Insufficient documentation

## 2013-09-12 DIAGNOSIS — Z794 Long term (current) use of insulin: Secondary | ICD-10-CM | POA: Insufficient documentation

## 2013-09-12 DIAGNOSIS — I129 Hypertensive chronic kidney disease with stage 1 through stage 4 chronic kidney disease, or unspecified chronic kidney disease: Secondary | ICD-10-CM | POA: Insufficient documentation

## 2013-09-12 DIAGNOSIS — Z95810 Presence of automatic (implantable) cardiac defibrillator: Secondary | ICD-10-CM | POA: Insufficient documentation

## 2013-09-12 DIAGNOSIS — R609 Edema, unspecified: Secondary | ICD-10-CM | POA: Insufficient documentation

## 2013-09-12 DIAGNOSIS — I252 Old myocardial infarction: Secondary | ICD-10-CM | POA: Insufficient documentation

## 2013-09-12 DIAGNOSIS — N183 Chronic kidney disease, stage 3 unspecified: Secondary | ICD-10-CM | POA: Insufficient documentation

## 2013-09-12 DIAGNOSIS — F329 Major depressive disorder, single episode, unspecified: Secondary | ICD-10-CM | POA: Insufficient documentation

## 2013-09-12 DIAGNOSIS — F3289 Other specified depressive episodes: Secondary | ICD-10-CM | POA: Insufficient documentation

## 2013-09-12 DIAGNOSIS — E119 Type 2 diabetes mellitus without complications: Secondary | ICD-10-CM | POA: Insufficient documentation

## 2013-09-12 DIAGNOSIS — Z950 Presence of cardiac pacemaker: Secondary | ICD-10-CM | POA: Insufficient documentation

## 2013-09-12 DIAGNOSIS — R5381 Other malaise: Secondary | ICD-10-CM | POA: Insufficient documentation

## 2013-09-12 LAB — CBC WITH DIFFERENTIAL/PLATELET
Basophils Absolute: 0 10*3/uL (ref 0.0–0.1)
Eosinophils Absolute: 0.1 10*3/uL (ref 0.0–0.7)
Eosinophils Relative: 2 % (ref 0–5)
Lymphocytes Relative: 16 % (ref 12–46)
MCH: 28.2 pg (ref 26.0–34.0)
MCV: 84.7 fL (ref 78.0–100.0)
Neutrophils Relative %: 73 % (ref 43–77)
Platelets: 384 10*3/uL (ref 150–400)
RBC: 4.12 MIL/uL (ref 3.87–5.11)
RDW: 14 % (ref 11.5–15.5)
WBC: 7.5 10*3/uL (ref 4.0–10.5)

## 2013-09-12 LAB — COMPREHENSIVE METABOLIC PANEL
Albumin: 3.2 g/dL — ABNORMAL LOW (ref 3.5–5.2)
Alkaline Phosphatase: 187 U/L — ABNORMAL HIGH (ref 39–117)
BUN: 36 mg/dL — ABNORMAL HIGH (ref 6–23)
CO2: 25 mEq/L (ref 19–32)
Chloride: 95 mEq/L — ABNORMAL LOW (ref 96–112)
Creatinine, Ser: 1.26 mg/dL — ABNORMAL HIGH (ref 0.50–1.10)
GFR calc non Af Amer: 41 mL/min — ABNORMAL LOW (ref >90)
Potassium: 4.6 mEq/L (ref 3.5–5.1)
Total Bilirubin: 0.9 mg/dL (ref 0.3–1.2)

## 2013-09-12 LAB — URINALYSIS, ROUTINE W REFLEX MICROSCOPIC
Bilirubin Urine: NEGATIVE
Nitrite: NEGATIVE
Protein, ur: 100 mg/dL — AB
Urobilinogen, UA: 1 mg/dL (ref 0.0–1.0)

## 2013-09-12 LAB — POCT I-STAT TROPONIN I: Troponin i, poc: 0.01 ng/mL (ref 0.00–0.08)

## 2013-09-12 MED ORDER — NITROGLYCERIN 2 % TD OINT
0.5000 [in_us] | TOPICAL_OINTMENT | Freq: Once | TRANSDERMAL | Status: AC
  Administered 2013-09-12: 0.5 [in_us] via TOPICAL
  Filled 2013-09-12: qty 1

## 2013-09-12 MED ORDER — ACETAMINOPHEN 500 MG PO TABS
1000.0000 mg | ORAL_TABLET | Freq: Once | ORAL | Status: AC
  Administered 2013-09-12: 1000 mg via ORAL
  Filled 2013-09-12: qty 2

## 2013-09-12 MED ORDER — FUROSEMIDE 10 MG/ML IJ SOLN
40.0000 mg | Freq: Once | INTRAMUSCULAR | Status: AC
  Administered 2013-09-12: 40 mg via INTRAVENOUS
  Filled 2013-09-12: qty 4

## 2013-09-12 NOTE — ED Notes (Signed)
Sob and gaining weight x 2 weeks and sob has gotten worse

## 2013-09-12 NOTE — ED Provider Notes (Signed)
CSN: [id number]     Arrival date & time 09/12/13  1022 History   First MD Initiated Contact with Patient 09/12/13 1108     Chief Complaint  Patient presents with  . Shortness of Breath   (Consider location/radiation/quality/duration/timing/severity/associated sxs/prior Treatment) HPI Patient presents with ongoing dyspnea, edema, fatigue. Symptoms began more than 1 week ago.  Since onset symptoms have been progressive. Patient notes that she is having difficulty balancing her medications. She denies any ongoing pain, lightheadedness, syncope, cough, fever, chills.  Past Medical History  Diagnosis Date  . Hypertension   . CHF (congestive heart failure)   . Hyperlipidemia   . Nonischemic cardiomyopathy     Hattie Perch 11/20/2012  . ICD (implantable cardiac defibrillator) in place 2006  . Chronic renal insufficiency, stage III (moderate)     Etiology unknown  . CKD (chronic kidney disease) stage 3, GFR 30-59 ml/min 11/20/2012  . Myocardial infarction 2006  . Pacemaker 2006  . Cardiac arrest 2006     etiology unknown, s/p dual chamber pacemaker & not AICD for ? 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 Complete heart block   . Shortness of breath     "laying down" (11/20/2012)  . Type II diabetes mellitus   . Arthritis     "fingers" (11/20/2012)  . Depression     "lost alot of family this year" (11/20/2012)   Past Surgical History  Procedure Laterality Date  . Tubal ligation  1970's  . Insert / replace / remove pacemaker  2006    Dual chamber pacemaker, for secondary prevention cardiac arrest  . Cardiac defibrillator placement  2006   Family History  Problem Relation Age of Onset  . Heart disease Brother   . Diabetes Mother   . Diabetes Father    History  Substance Use Topics  . Smoking status: Former Smoker -- 2.00 packs/day for 45 years    Types: Cigarettes  . Smokeless tobacco: Never Used     Comment: 11/20/2012 "quit smoking in the 1990's"  . Alcohol Use: No     Comment: 11/20/2012 "used to  drink quite a bit; quit drinking ~ 1990's"    OB History   Grav Para Term Preterm Abortions TAB SAB Ect Mult Living                 Review of Systems  Constitutional:       Per HPI, otherwise negative  HENT:       Per HPI, otherwise negative  Respiratory:       Per HPI, otherwise negative  Cardiovascular:       Per HPI, otherwise negative  Gastrointestinal: Negative for vomiting.  Endocrine:       Negative aside from HPI  Genitourinary:       Neg aside from HPI   Musculoskeletal:       Per HPI, otherwise negative  Skin: Negative.   Neurological: Negative for syncope.    Allergies  Codeine and Statins  Home Medications   Current Outpatient Rx  Name  Route  Sig  Dispense  Refill  . albuterol (PROVENTIL HFA;VENTOLIN HFA) 108 (90 BASE) MCG/ACT inhaler   Inhalation   Inhale 2 puffs into the lungs every 6 (six) hours as needed. For wheezing         . amitriptyline (ELAVIL) 10 MG tablet   Oral   Take 20 mg by mouth at bedtime.          . furosemide (LASIX) 40 MG tablet  Oral   Take 40 mg by mouth daily.         . hydrALAZINE (APRESOLINE) 25 MG tablet   Oral   Take 1.5 tablets (37.5 mg total) by mouth 3 (three) times daily.   150 tablet   5   . insulin glargine (LANTUS) 100 UNIT/ML injection   Subcutaneous   Inject 60-65 Units into the skin 2 (two) times daily.         . metoprolol tartrate (LOPRESSOR) 25 MG tablet   Oral   Take 1 tablet (25 mg total) by mouth 2 (two) times daily.   60 tablet   0     Please have patient contact our office for an offi ...   . naproxen sodium (ANAPROX) 220 MG tablet   Oral   Take 440 mg by mouth daily as needed. For pain          BP 169/78  Pulse 93  Temp(Src) 98.7 F (37.1 C)  Resp 20  SpO2 94% Physical Exam  Nursing note and vitals reviewed. Constitutional: She is oriented to person, place, and time. She appears well-developed and well-nourished. No distress.  HENT:  Head: Normocephalic and atraumatic.   Eyes: Conjunctivae and EOM are normal.  Cardiovascular: Normal rate and regular rhythm.   Pulmonary/Chest: Effort normal. No stridor. No respiratory distress. She has decreased breath sounds. She has no wheezes.  pacemaker nontender, no superficial changes  Abdominal: She exhibits no distension.  Musculoskeletal: She exhibits edema. She exhibits no tenderness.  Pitting edema, symmetrically in both LE  Neurological: She is alert and oriented to person, place, and time. No cranial nerve deficit.  Skin: Skin is warm and dry.  Psychiatric: She has a normal mood and affect.    ED Course  Procedures (including critical care time) Labs Review Labs Reviewed  PRO B NATRIURETIC PEPTIDE - Abnormal; Notable for the following:    Pro B Natriuretic peptide (BNP) 4807.0 (*)    All other components within normal limits  COMPREHENSIVE METABOLIC PANEL - Abnormal; Notable for the following:    Sodium 133 (*)    Chloride 95 (*)    Glucose, Bld 275 (*)    BUN 36 (*)    Creatinine, Ser 1.26 (*)    Albumin 3.2 (*)    AST 48 (*)    ALT 64 (*)    Alkaline Phosphatase 187 (*)    GFR calc non Af Amer 41 (*)    GFR calc Af Amer 47 (*)    All other components within normal limits  CBC WITH DIFFERENTIAL - Abnormal; Notable for the following:    Hemoglobin 11.6 (*)    HCT 34.9 (*)    All other components within normal limits  URINALYSIS, ROUTINE W REFLEX MICROSCOPIC  POCT I-STAT TROPONIN I   Imaging Review Dg Chest 2 View  09/12/2013   CLINICAL DATA:  Shortness of breath  EXAM: CHEST  2 VIEW  COMPARISON:  11/19/2012  FINDINGS: The cardiac shadow remains mildly enlarged. A pacing device is again seen. Mild interstitial changes are noted consistent with interstitial edema. Small effusions are noted bilaterally. No focal infiltrate is seen.  IMPRESSION: Mild congestive changes.   Electronically Signed   By: Alcide Clever M.D.   On: 09/12/2013 11:18    EKG Interpretation     Ventricular Rate:  93 PR  Interval:  162 QRS Duration: 206 QT Interval:  474 QTC Calculation: 589 R Axis:   -60 Text Interpretation:  Atrial-sensed  ventricular-paced rhythm Abnormal ECG           O2- 96%ra, nml  Cardiac: 90 paced, abn   Update: I reviewed exam the patient appears comfortable.  I discussed all results with her and her daughter.  To follow up with her cardiology team. MDM  No diagnosis found. This patient presents with ongoing dyspnea.  Notably, the patient will history of CHF, and endorses a consistent medication compliance.  On exam she is awake alert, afebrile.  There is no evidence of pneumonia, but there is evidence of ongoing congestive heart failure.  Patient received IV Lasix, topical nitroglycerin glycerin, had improvement in her clinical condition.  Lesko she was appropriate for discharge with further evaluation and management patient.  There is low suspicion for ACS versus occult infection    Gerhard Munch, MD 09/12/13 1357

## 2013-09-13 LAB — URINE CULTURE
Colony Count: NO GROWTH
Culture: NO GROWTH

## 2013-09-20 ENCOUNTER — Other Ambulatory Visit: Payer: Self-pay | Admitting: *Deleted

## 2013-09-20 MED ORDER — METOPROLOL TARTRATE 25 MG PO TABS: ORAL_TABLET | ORAL | Status: DC

## 2013-09-20 NOTE — Telephone Encounter (Signed)
Rx was sent to pharmacy electronically. 

## 2013-09-21 ENCOUNTER — Ambulatory Visit (INDEPENDENT_AMBULATORY_CARE_PROVIDER_SITE_OTHER): Payer: Medicare Other | Admitting: Cardiovascular Disease

## 2013-09-21 ENCOUNTER — Encounter: Payer: Self-pay | Admitting: Cardiovascular Disease

## 2013-09-21 VITALS — BP 118/86 | HR 86 | Resp 16 | Ht 63.0 in | Wt 158.8 lb

## 2013-09-21 DIAGNOSIS — I509 Heart failure, unspecified: Secondary | ICD-10-CM

## 2013-09-21 DIAGNOSIS — Z79899 Other long term (current) drug therapy: Secondary | ICD-10-CM

## 2013-09-21 DIAGNOSIS — I428 Other cardiomyopathies: Secondary | ICD-10-CM

## 2013-09-21 DIAGNOSIS — I38 Endocarditis, valve unspecified: Secondary | ICD-10-CM

## 2013-09-21 DIAGNOSIS — E119 Type 2 diabetes mellitus without complications: Secondary | ICD-10-CM

## 2013-09-21 DIAGNOSIS — E785 Hyperlipidemia, unspecified: Secondary | ICD-10-CM

## 2013-09-21 DIAGNOSIS — I429 Cardiomyopathy, unspecified: Secondary | ICD-10-CM

## 2013-09-21 DIAGNOSIS — I10 Essential (primary) hypertension: Secondary | ICD-10-CM

## 2013-09-21 DIAGNOSIS — I5023 Acute on chronic systolic (congestive) heart failure: Secondary | ICD-10-CM

## 2013-09-21 DIAGNOSIS — R011 Cardiac murmur, unspecified: Secondary | ICD-10-CM

## 2013-09-21 DIAGNOSIS — R0602 Shortness of breath: Secondary | ICD-10-CM

## 2013-09-21 DIAGNOSIS — I442 Atrioventricular block, complete: Secondary | ICD-10-CM

## 2013-09-21 LAB — PACEMAKER DEVICE OBSERVATION

## 2013-09-21 MED ORDER — SPIRONOLACTONE 25 MG PO TABS: 25.0000 mg | ORAL_TABLET | Freq: Every day | ORAL | Status: DC

## 2013-09-21 MED ORDER — ISOSORBIDE DINITRATE 10 MG PO TABS: 30.0000 mg | ORAL_TABLET | Freq: Three times a day (TID) | ORAL | Status: DC

## 2013-09-21 NOTE — Patient Instructions (Signed)
START Spironolactone 25mg  take 1 tablet daily.  START Isosorbide DN 10mg  take 3 times daily.  Have lab work done in 4-5 weeks at [facility] on the first floor.  You do not need an appointment.  Your physician recommends that you schedule a follow-up appointment in: 6 weeks.

## 2013-09-22 ENCOUNTER — Encounter: Payer: Self-pay | Admitting: Cardiovascular Disease

## 2013-09-22 DIAGNOSIS — I38 Endocarditis, valve unspecified: Secondary | ICD-10-CM | POA: Insufficient documentation

## 2013-09-22 NOTE — Assessment & Plan Note (Signed)
This is quite prominent on physical exam and I do not find an explanation for it on the echocardiogram that was performed in November of 2013. 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 The echo report specifically says there was no pulmonary insufficiency. I recommend a repeat echocardiogram.

## 2013-09-22 NOTE — Assessment & Plan Note (Signed)
At home she states that her blood pressure is considerably lower at times although never less than 100 mm Hg.

## 2013-09-22 NOTE — Assessment & Plan Note (Addendum)
Statistically, the most likely cause of her cardiomyopathy is coronary artery disease, although she does not have angina pectoris or clear-cut history of acute myocardial infarction. Ischemic cardiomyopathy is made more likely by the presence of an extensive wall motion abnormality in the anteroseptal distribution, suggestive of an LAD very problem. Nuclear stress testing performed in 2013 also showed an ejection fraction of 23% with fixed perfusion or maladies in the anterior septum and apex. Your wall is most described as abnormal, although the opinion was given that this might be due to diaphragmatic attenuation artifact. I don't think she has been receiving maximum medical therapy at this point. She is receiving hydralazine but not nitrates. Will add isosorbide 10 mg 3 times daily, and Spironolactone and will try to gradually increase heart failure medications. Reassess left ventricular systolic function in about 3 months. 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 I discussed this procedure with her today. We also discussed the pros and cons of defibrillator therapy. Have asked her to consider these options carefully over the next 3 months, since we'll have to make some decisions regarding referral for CRT P. versus CRT-D. Since there was no evidence of reversible ischemia by nuclear stress testing and she is free of angina, I am not sure that coronary angiography would make a great difference in her management. This is something we will definitely have to consider before upgrading to a defibrillator if she chooses this path.

## 2013-09-22 NOTE — Assessment & Plan Note (Addendum)
Satisfactory parameters on lipid lowering therapy. Note minimally elevated transaminases. Her medication list and no longer contains atorvastatin, but she thinks she may still be taking it. She will check at home.

## 2013-09-22 NOTE — Progress Notes (Signed)
Patient ID: [name] [name], female   DOB: 1941-08-09, 72 y.o.   MRN: [id number]      Reason for office visit Congestive heart failure, pacemaker followup  This is the first time I've met [name] [name]. She is originally from [place] from a town not far from [facility]. She moved to [place] to be close to her daughter and has an extensive and not well-defined set of cardiac problems. She was seen once during a hospitalization about a year ago and has subsequently not complied with medical followup. She has traveled back-and-forth to [place] a couple of times because of some tragic family issues. Her son was murdered this summer.  She went to the emergency room on October 22 for shortness of breath and her diuretic dose was increased. Her chest x-ray showed mild congestive changes, her proBNP was around 5000 (comparable with previous values). She has lost roughly 3 pounds in weight since then and her breathing is a little better. She has a lot of problems taking her medicines and her daughter has become more involved with this. She has found many of her pills scattered on the floor.   Allergies  Allergen Reactions  . Codeine Nausea Only  . Statins Diarrhea    Current Outpatient Prescriptions  Medication Sig Dispense Refill  . ADVAIR DISKUS 250-50 MCG/DOSE AEPB       . albuterol (PROVENTIL HFA;VENTOLIN HFA) 108 (90 BASE) MCG/ACT inhaler Inhale 2 puffs into the lungs every 6 (six) hours as needed. For wheezing      . amitriptyline (ELAVIL) 10 MG tablet Take 20 mg by mouth at bedtime.       . furosemide (LASIX) 40 MG tablet Take 60 mg by mouth daily.       . hydrALAZINE (APRESOLINE) 25 MG tablet Take 1.5 tablets (37.5 mg total) by mouth 3 (three) times daily.  150 tablet  5  . insulin glargine (LANTUS) 100 UNIT/ML injection Inject 30-35 Units into the skin 2 (two) times daily.       . metoprolol tartrate (LOPRESSOR) 25 MG tablet Take 1 tablet by mouth two times daily. <Please schedule  appointment>  30 tablet  0  . naproxen sodium (ANAPROX) 220 MG tablet Take 440 mg by mouth daily as needed. For pain      . isosorbide dinitrate (ISORDIL) 10 MG tablet Take 3 tablets (30 mg total) by mouth 3 (three) times daily.  90 tablet  5  . spironolactone (ALDACTONE) 25 MG tablet Take 1 tablet (25 mg total) by mouth daily.  30 tablet  5   No current facility-administered medications for this visit.    Past Medical History  Diagnosis Date  . Hypertension   . CHF (congestive heart failure)   . Hyperlipidemia   . Nonischemic cardiomyopathy     Hattie Perch 11/20/2012  . ICD (implantable cardiac defibrillator) in place 2006  . Chronic renal insufficiency, stage III (moderate)     Etiology unknown  . CKD (chronic kidney disease) stage 3, GFR 30-59 ml/min 11/20/2012  . Myocardial infarction 2006  . Pacemaker 2006  . Cardiac arrest 2006     etiology unknown, s/p dual chamber pacemaker & not AICD for ? 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 Complete heart block   . Shortness of breath     "laying down" (11/20/2012)  . Type II diabetes mellitus   . Arthritis     "fingers" (11/20/2012)  . Depression     "lost alot of family this year" (11/20/2012)  Past Surgical History  Procedure Laterality Date  . Tubal ligation  1970's  . Insert / replace / remove pacemaker  04/18/2005    Dual chamber pacemaker, for secondary prevention cardiac arrest  . Cardiac defibrillator placement  2005/04/18    Family History  Problem Relation Age of Onset  . Heart disease Brother   . Diabetes Mother   . Diabetes Father     History   Social History  . Marital Status: Widowed    Spouse Name: N/A    Number of Children: N/A  . Years of Education: N/A   Occupational History  . Not on file.   Social History Main Topics  . Smoking status: Former Smoker -- 2.00 packs/day for 45 years    Types: Cigarettes  . Smokeless tobacco: Never Used     Comment: 11/20/2012 "quit smoking in the 1990's"  . Alcohol Use: No     Comment:  11/20/2012 "used to drink quite a bit; quit drinking ~ 1990's"   . Drug Use: No  . Sexual Activity: Not Currently   Other Topics Concern  . Not on file   Social History Narrative   Pt is widowed x2. 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 Moved here in July 2013 to be closer to daughter. One son died of MI earlier 2012-04-18 and another was murdered later this summer. 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 3 pack year smoking history. Lives in [place] on her own, near daughter.     Review of systems: Shortness of breath with mild exertion. No orthopnea or PND. Mild edema. No syncope, palpitations, chest pain, dizziness or lightheadedness. Denies new focal neurological deficits. Denies cough, wheezing, hemoptysis, fever, chills, abdominal pain, diarrhea, constipation, urinary symptoms, hematuria, gastrointestinal bleeding, easy bruising, focal cortical deficits, and to claudication, change in skin or hair texture. Mood is very depressed. Appetite is okay. Denies insomnia.  PHYSICAL EXAM BP 118/86  Pulse 86  Resp 16  Ht 5\' 3"  (1.6 m)  Wt 158 lb 12.8 oz (72.031 kg)  BMI 28.14 kg/m2 General appearance: alert, cooperative and no distress Neck: JVD - 6 cm above sternal notch, no adenopathy, no carotid bruit, supple, symmetrical, trachea midline and thyroid not enlarged, symmetric, no tenderness/mass/nodules Lungs: clear to auscultation bilaterally Heart: regular rate and rhythm, S1: normal, S2: paradoxically splitting, systolic murmur: early systolic 2/6, crescendo and decrescendo at 2nd right intercostal space, 2nd systolic murmur: holosystolic 2/6, blowing at apex, diastolic murmur: holodiastolic 3/6, decrescendo at 2nd right intercostal space and no rub Abdomen: soft, non-tender; bowel sounds normal; no masses,  no organomegaly Extremities: edema 1-2+ ankle Pulses: 2+ and symmetric Skin: Skin color, texture, turgor normal. No rashes or lesions Neurologic: Alert and oriented X  3, normal strength and tone. Normal symmetric reflexes. Normal coordination and gait   EKG: Atrial sensed ventricular paced  Lipid Panel     Component Value Date/Time   CHOL 114 10/01/2012 1355   TRIG 66 10/01/2012 1355   HDL 31* 10/01/2012 1355   CHOLHDL 3.7 10/01/2012 1355   VLDL 13 10/01/2012 1355   LDLCALC 70 10/01/2012 1355    BMET    Component Value Date/Time   NA 133* 09/12/2013 1035   K 4.6 09/12/2013 1035   CL 95* 09/12/2013 1035   CO2 25 09/12/2013 1035   GLUCOSE 275* 09/12/2013 1035   BUN 36* 09/12/2013 1035   CREATININE 1.26* 09/12/2013 1035   CALCIUM 9.3 09/12/2013 1035   GFRNONAA 41* 09/12/2013 1035  GFRAA 47* 09/12/2013 1035     ASSESSMENT AND PLAN Cardiomyopathy, (? ischemic AS AK on echo), EF 25% Statistically, the most likely cause of her cardiomyopathy is coronary artery disease, although she does not have angina pectoris or clear-cut history of acute myocardial infarction. Ischemic cardiomyopathy is made more likely by the presence of an extensive wall motion abnormality in the anteroseptal distribution, suggestive of an LAD very problem. Nuclear stress testing performed in 2013 also showed an ejection fraction of 23% with fixed perfusion or maladies in the anterior septum and apex. Your wall is most described as abnormal, although the opinion was given that this might be due to diaphragmatic attenuation artifact. I don't think she has been receiving maximum medical therapy at this point. She is receiving hydralazine but not nitrates. Will add isosorbide 10 mg 3 times daily, and Spironolactone and will try to gradually increase heart failure medications. Reassess left ventricular systolic function in about 3 months. 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 I discussed this procedure with her today. We also discussed the pros and cons of defibrillator therapy. Have  asked her to consider these options carefully over the next 3 months, since we'll have to make some decisions regarding referral for CRT P. versus CRT-D. Since there was no evidence of reversible ischemia by nuclear stress testing and she is free of angina, I am not sure that coronary angiography would make a great difference in her management. This is something we will definitely have to consider before upgrading to a defibrillator if she chooses this path.  Complete heart block s/p dual chamber Medtronic pacmaker 2006 She is pacemaker dependent. She has a Medtronic Enpulse device that is approaching ERI probably early next year. Decision regarding of grade 2 or CRT-D versus CRT-D device should be made before generator change out. The leads are Medtronic [id number] respectively both implanted in August of 2006 with normal sensing and pacing parameters.  Hypertension At home she states that her blood pressure is considerably lower at times although never less than 100 mm Hg.  Diastolic murmur This is quite prominent on physical exam and I do not find an explanation for it on the echocardiogram that was performed in November of 2013. 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 The echo report specifically says there was no pulmonary insufficiency. I recommend a repeat echocardiogram.  Hyperlipidemia Satisfactory parameters on lipid lowering therapy. Note minimally elevated transaminases. Her medication list and no longer contains atorvastatin, but she thinks she may still be taking it. She will check at home.  Diabetes mellitus Insulin requiring. Most recent hemoglobin A1c demonstrates poor control (9.2% one year ago). Moderate renal insufficiency may be related to diabetic nephropathy, but is substantially better when compared with creatinine of 1.97 in November of 2013   Orders Placed This Encounter    Procedures  . Comp Met (CMET)  . B Nat Peptide  . EKG 12-Lead  . 2D Echocardiogram with contrast   Meds ordered this encounter  Medications  . ADVAIR DISKUS 250-50 MCG/DOSE AEPB    Sig:   . isosorbide dinitrate (ISORDIL) 10 MG tablet    Sig: Take 3 tablets (30 mg total) by mouth 3 (three) times daily.    Dispense:  90 tablet    Refill:  5  . spironolactone (ALDACTONE) 25 MG tablet    Sig: Take 1 tablet (  25 mg total) by mouth daily.    Dispense:  30 tablet    Refill:  5    Millard Bautch  Thurmon Fair, MD, [facility] [phone] office [phone] pager

## 2013-09-22 NOTE — Assessment & Plan Note (Signed)
Insulin requiring. Most recent hemoglobin A1c demonstrates poor control (9.2% one year ago). Moderate renal insufficiency may be related to diabetic nephropathy, but is substantially better when compared with creatinine of 1.97 in November of 2013

## 2013-09-22 NOTE — Assessment & Plan Note (Addendum)
She is pacemaker dependent. She has a Medtronic Enpulse device that is approaching ERI probably early next year. Decision regarding of grade 2 or CRT-D versus CRT-D device should be made before generator change out. The leads are Medtronic [id number] respectively both implanted in August of 2006 with normal sensing and pacing parameters.

## 2013-09-24 ENCOUNTER — Telehealth: Payer: Self-pay | Admitting: Cardiovascular Disease

## 2013-09-24 NOTE — Telephone Encounter (Signed)
Please call-question about her medicine-question about her quainity of Isosorbide.

## 2013-09-24 NOTE — Telephone Encounter (Signed)
Returned call and pt verified x 2 w/ daughter, Suzette Battiest.  Stated she thought pt was supposed to take isosorbide dinitrate 10 mg three times a day and when she picked it up it said to take 3 pills three times a day.  RN reviewed chart.  AVS states to take 10 mg three times daily and Rx does state to take 3 pills (30 mg) three times daily.  Daughter informed RN will clarify w/ provider and advised pt take one tab three times daily until further notice.  Daughter agreed.  Message forwarded to Dr. Royann Shivers.

## 2013-09-24 NOTE — Telephone Encounter (Signed)
Please call before the day is over-concerning her medicine.

## 2013-09-25 ENCOUNTER — Telehealth (HOSPITAL_COMMUNITY): Payer: Self-pay | Admitting: *Deleted

## 2013-09-25 NOTE — Telephone Encounter (Signed)
Returned call and left message w/ instructions by MD to take med one tab three times daily.  Call back today before 4pm if questions.

## 2013-09-25 NOTE — Telephone Encounter (Signed)
10 mg TID to start. I guess it will last three times as long! We may actually increase it as we go along , so it's OK that she has extra.

## 2013-09-26 LAB — PACEMAKER DEVICE OBSERVATION
AL AMPLITUDE: 4 mv
AL THRESHOLD: 0.5 V
ATRIAL PACING PM: 5.1
BAMS-0001: 150 {beats}/min
BATTERY VOLTAGE: 2.64 V
VENTRICULAR PACING PM: 99.1

## 2013-09-27 ENCOUNTER — Other Ambulatory Visit: Payer: Self-pay

## 2013-10-02 ENCOUNTER — Other Ambulatory Visit: Payer: Self-pay | Admitting: Cardiovascular Disease

## 2013-10-02 NOTE — Telephone Encounter (Signed)
Rx was sent to pharmacy electronically. 

## 2013-10-11 ENCOUNTER — Telehealth: Payer: Self-pay | Admitting: Cardiovascular Disease

## 2013-10-11 MED ORDER — FUROSEMIDE 40 MG PO TABS: 40.0000 mg | ORAL_TABLET | Freq: Every day | ORAL | Status: DC

## 2013-10-11 NOTE — Telephone Encounter (Signed)
She is losing too much weight from her fluid pills.she gotten so weak she can hardly walk,fell the other night.Also think she might need some home health service.

## 2013-10-11 NOTE — Telephone Encounter (Signed)
Returned call and pt verified x 2 w/ pt's daughter, Suzette Battiest.  Stated pt has lost 16 lbs since 10.31.14.  Stated she is really weak and fell a few nights ago.  Stated pt was taking one Lasix a day and now is taking 1 1/2 a day and the spironolactone.  Stated she has been checking on pt every 3-4 hrs to make sure she is getting to the bathroom.  Wants to know if pt can get [facility] services and therapy b/c she is so weak.  RN asked about pt's fluid intake.  Daughter stated pt drinks at lease one Boost for diabetics, diet Coke/Sprite, milk and little water.  Daughter informed pt has to increase water intake while on diuretics.  Explained that the diuretic is going to work regardless of pt taking in fluid or not and her symptoms are likely r/t the decreased fluid intake.  Informed Dr. Royann Shivers will be notified for further instructions and may want to have labs drawn.  Verbalized understanding and stated pt is due to have labs done on the 25th.  Reviewed orders and pt is due for CMP and BNP.  Informed RN will call her back when response given.  Dr. Royann Shivers notified and advised: Decrease Lasix to 40mg  daily, Continue current dose of spironolactone and labs now.  Call to daughter and informed.  Verbalized understanding and agreed w/ plan.  Informed she will be contacted when results reviewed by MD.

## 2013-10-12 ENCOUNTER — Telehealth: Payer: Self-pay | Admitting: Cardiovascular Disease

## 2013-10-12 ENCOUNTER — Emergency Department (HOSPITAL_COMMUNITY): Payer: Medicare Other

## 2013-10-12 ENCOUNTER — Encounter (HOSPITAL_COMMUNITY): Payer: Self-pay | Admitting: Emergency Medicine

## 2013-10-12 ENCOUNTER — Inpatient Hospital Stay (HOSPITAL_COMMUNITY)
Admission: EM | Admit: 2013-10-12 | Discharge: 2013-10-15 | DRG: 638 | Disposition: A | Discharge status: Home Health | Payer: Medicare Other | Attending: Internal Medicine | Admitting: Internal Medicine

## 2013-10-12 DIAGNOSIS — E861 Hypovolemia: Secondary | ICD-10-CM | POA: Diagnosis present

## 2013-10-12 DIAGNOSIS — Z9119 Patient's noncompliance with other medical treatment and regimen: Secondary | ICD-10-CM

## 2013-10-12 DIAGNOSIS — I5023 Acute on chronic systolic (congestive) heart failure: Secondary | ICD-10-CM

## 2013-10-12 DIAGNOSIS — Z8674 Personal history of sudden cardiac arrest: Secondary | ICD-10-CM

## 2013-10-12 DIAGNOSIS — R739 Hyperglycemia, unspecified: Secondary | ICD-10-CM

## 2013-10-12 DIAGNOSIS — I10 Essential (primary) hypertension: Secondary | ICD-10-CM | POA: Diagnosis present

## 2013-10-12 DIAGNOSIS — I509 Heart failure, unspecified: Secondary | ICD-10-CM | POA: Diagnosis present

## 2013-10-12 DIAGNOSIS — I429 Cardiomyopathy, unspecified: Secondary | ICD-10-CM | POA: Diagnosis present

## 2013-10-12 DIAGNOSIS — F329 Major depressive disorder, single episode, unspecified: Secondary | ICD-10-CM | POA: Diagnosis present

## 2013-10-12 DIAGNOSIS — I252 Old myocardial infarction: Secondary | ICD-10-CM

## 2013-10-12 DIAGNOSIS — F3289 Other specified depressive episodes: Secondary | ICD-10-CM | POA: Diagnosis present

## 2013-10-12 DIAGNOSIS — E119 Type 2 diabetes mellitus without complications: Secondary | ICD-10-CM

## 2013-10-12 DIAGNOSIS — N390 Urinary tract infection, site not specified: Secondary | ICD-10-CM | POA: Diagnosis present

## 2013-10-12 DIAGNOSIS — Z91199 Patient's noncompliance with other medical treatment and regimen due to unspecified reason: Secondary | ICD-10-CM

## 2013-10-12 DIAGNOSIS — N179 Acute kidney failure, unspecified: Secondary | ICD-10-CM | POA: Diagnosis present

## 2013-10-12 DIAGNOSIS — Z794 Long term (current) use of insulin: Secondary | ICD-10-CM

## 2013-10-12 DIAGNOSIS — I38 Endocarditis, valve unspecified: Secondary | ICD-10-CM

## 2013-10-12 DIAGNOSIS — R011 Cardiac murmur, unspecified: Secondary | ICD-10-CM

## 2013-10-12 DIAGNOSIS — Z79899 Other long term (current) drug therapy: Secondary | ICD-10-CM

## 2013-10-12 DIAGNOSIS — Z833 Family history of diabetes mellitus: Secondary | ICD-10-CM

## 2013-10-12 DIAGNOSIS — I5022 Chronic systolic (congestive) heart failure: Secondary | ICD-10-CM | POA: Diagnosis present

## 2013-10-12 DIAGNOSIS — I428 Other cardiomyopathies: Secondary | ICD-10-CM | POA: Diagnosis present

## 2013-10-12 DIAGNOSIS — E785 Hyperlipidemia, unspecified: Secondary | ICD-10-CM | POA: Diagnosis present

## 2013-10-12 DIAGNOSIS — E860 Dehydration: Secondary | ICD-10-CM | POA: Diagnosis present

## 2013-10-12 DIAGNOSIS — Z95810 Presence of automatic (implantable) cardiac defibrillator: Secondary | ICD-10-CM

## 2013-10-12 DIAGNOSIS — I129 Hypertensive chronic kidney disease with stage 1 through stage 4 chronic kidney disease, or unspecified chronic kidney disease: Secondary | ICD-10-CM | POA: Diagnosis present

## 2013-10-12 DIAGNOSIS — R81 Glycosuria: Secondary | ICD-10-CM | POA: Diagnosis present

## 2013-10-12 DIAGNOSIS — E1100 Type 2 diabetes mellitus with hyperosmolarity without nonketotic hyperglycemic-hyperosmolar coma (NKHHC): Principal | ICD-10-CM | POA: Diagnosis present

## 2013-10-12 DIAGNOSIS — Z8249 Family history of ischemic heart disease and other diseases of the circulatory system: Secondary | ICD-10-CM

## 2013-10-12 DIAGNOSIS — Z87891 Personal history of nicotine dependence: Secondary | ICD-10-CM

## 2013-10-12 DIAGNOSIS — N183 Chronic kidney disease, stage 3 unspecified: Secondary | ICD-10-CM | POA: Diagnosis present

## 2013-10-12 DIAGNOSIS — I442 Atrioventricular block, complete: Secondary | ICD-10-CM | POA: Diagnosis present

## 2013-10-12 DIAGNOSIS — E871 Hypo-osmolality and hyponatremia: Secondary | ICD-10-CM | POA: Diagnosis present

## 2013-10-12 LAB — URINALYSIS, ROUTINE W REFLEX MICROSCOPIC
Glucose, UA: >1000 mg/dL — AB
Ketones, ur: NEGATIVE mg/dL
Protein, ur: 100 mg/dL — AB
Specific Gravity, Urine: 1.026 (ref 1.005–1.030)
pH: 6 (ref 5.0–8.0)

## 2013-10-12 LAB — URINE MICROSCOPIC-ADD ON

## 2013-10-12 LAB — COMPREHENSIVE METABOLIC PANEL
ALT: 16 U/L (ref 0–35)
ALT: 17 U/L (ref 0–35)
AST: 16 U/L (ref 0–37)
AST: 16 U/L (ref 0–37)
Albumin: 3.5 g/dL (ref 3.5–5.2)
Albumin: 3.6 g/dL (ref 3.5–5.2)
Alkaline Phosphatase: 130 U/L — ABNORMAL HIGH (ref 39–117)
Alkaline Phosphatase: 148 U/L — ABNORMAL HIGH (ref 39–117)
BUN: 63 mg/dL — ABNORMAL HIGH (ref 6–23)
BUN: 69 mg/dL — ABNORMAL HIGH (ref 6–23)
Calcium: 9.5 mg/dL (ref 8.4–10.5)
Chloride: 88 mEq/L — ABNORMAL LOW (ref 96–112)
Creat: 1.8 mg/dL — ABNORMAL HIGH (ref 0.50–1.10)
Potassium: 5.5 mEq/L — ABNORMAL HIGH (ref 3.5–5.3)
Potassium: 4.5 mEq/L (ref 3.5–5.1)
Sodium: 122 mEq/L — ABNORMAL LOW (ref 135–145)
Sodium: 123 mEq/L — ABNORMAL LOW (ref 135–145)
Total Bilirubin: 0.7 mg/dL (ref 0.3–1.2)
Total Protein: 8.6 g/dL — ABNORMAL HIGH (ref 6.0–8.3)

## 2013-10-12 LAB — CBC
HCT: 42.4 % (ref 36.0–46.0)
HCT: 39.5 % (ref 36.0–46.0)
Hemoglobin: 15.2 g/dL — ABNORMAL HIGH (ref 12.0–15.0)
MCH: 27.8 pg (ref 26.0–34.0)
MCH: 25.7 pg — ABNORMAL LOW (ref 26.0–34.0)
MCHC: 35.8 g/dL (ref 30.0–36.0)
MCHC: 33.4 g/dL (ref 30.0–36.0)
MCV: 77 fL — ABNORMAL LOW (ref 78.0–100.0)
Platelets: 298 10*3/uL (ref 150–400)
RBC: 5.47 MIL/uL — ABNORMAL HIGH (ref 3.87–5.11)
RBC: 5.13 MIL/uL — ABNORMAL HIGH (ref 3.87–5.11)
RDW: 13.8 % (ref 11.5–15.5)

## 2013-10-12 LAB — GLUCOSE, CAPILLARY
Glucose-Capillary: >600 mg/dL (ref 70–99)
Glucose-Capillary: 523 mg/dL — ABNORMAL HIGH (ref 70–99)
Glucose-Capillary: 322 mg/dL — ABNORMAL HIGH (ref 70–99)
Glucose-Capillary: 265 mg/dL — ABNORMAL HIGH (ref 70–99)
Glucose-Capillary: 191 mg/dL — ABNORMAL HIGH (ref 70–99)

## 2013-10-12 LAB — BASIC METABOLIC PANEL
BUN: 65 mg/dL — ABNORMAL HIGH (ref 6–23)
Chloride: 91 mEq/L — ABNORMAL LOW (ref 96–112)
Creatinine, Ser: 1.55 mg/dL — ABNORMAL HIGH (ref 0.50–1.10)
GFR calc Af Amer: 37 mL/min — ABNORMAL LOW (ref >90)
GFR calc non Af Amer: 32 mL/min — ABNORMAL LOW (ref >90)
Glucose, Bld: 542 mg/dL — ABNORMAL HIGH (ref 70–99)

## 2013-10-12 LAB — POCT I-STAT 3, VENOUS BLOOD GAS (G3P V)
Bicarbonate: 24.1 mEq/L — ABNORMAL HIGH (ref 20.0–24.0)
O2 Saturation: 80 %
TCO2: 25 mmol/L (ref 0–100)
pCO2, Ven: 37.3 mmHg — ABNORMAL LOW (ref 45.0–50.0)
pH, Ven: 7.418 — ABNORMAL HIGH (ref 7.250–7.300)
pO2, Ven: 43 mmHg (ref 30.0–45.0)

## 2013-10-12 MED ORDER — METOPROLOL TARTRATE 25 MG PO TABS
25.0000 mg | ORAL_TABLET | Freq: Two times a day (BID) | ORAL | Status: DC
  Administered 2013-10-12 – 2013-10-15 (×6): 25 mg via ORAL
  Filled 2013-10-12 (×9): qty 1

## 2013-10-12 MED ORDER — HYDROMORPHONE HCL PF 1 MG/ML IJ SOLN: 1.0000 mg | INTRAMUSCULAR | Status: DC | PRN

## 2013-10-12 MED ORDER — DEXTROSE 50 % IV SOLN: 25.0000 mL | INTRAVENOUS | Status: DC | PRN

## 2013-10-12 MED ORDER — HYDROCODONE-ACETAMINOPHEN 5-325 MG PO TABS
1.0000 | ORAL_TABLET | ORAL | Status: DC | PRN
  Administered 2013-10-13 – 2013-10-14 (×2): 1 via ORAL
  Filled 2013-10-12 (×2): qty 1

## 2013-10-12 MED ORDER — SODIUM CHLORIDE 0.9 % IV SOLN
INTRAVENOUS | Status: DC
  Administered 2013-10-12: 5.4 [IU]/h via INTRAVENOUS
  Administered 2013-10-12: 23:00:00 1.9 [IU]/h via INTRAVENOUS
  Administered 2013-10-12: 22:00:00 5.2 [IU]/h via INTRAVENOUS
  Administered 2013-10-12: 19:00:00 1.2 [IU]/h via INTRAVENOUS
  Administered 2013-10-12: 21:00:00 6.2 [IU]/h via INTRAVENOUS
  Administered 2013-10-13: 3.1 [IU]/h via INTRAVENOUS
  Administered 2013-10-13 (×2): 2.6 [IU]/h via INTRAVENOUS
  Filled 2013-10-12 (×2): qty 1

## 2013-10-12 MED ORDER — SODIUM CHLORIDE 0.9 % IJ SOLN
3.0000 mL | Freq: Two times a day (BID) | INTRAMUSCULAR | Status: DC
  Administered 2013-10-13 – 2013-10-15 (×5): 3 mL via INTRAVENOUS

## 2013-10-12 MED ORDER — ACETAMINOPHEN 650 MG RE SUPP: 650.0000 mg | Freq: Four times a day (QID) | RECTAL | Status: DC | PRN

## 2013-10-12 MED ORDER — DEXTROSE-NACL 5-0.45 % IV SOLN
INTRAVENOUS | Status: DC
  Administered 2013-10-12: 22:00:00 via INTRAVENOUS

## 2013-10-12 MED ORDER — SODIUM CHLORIDE 0.9 % IV BOLUS (SEPSIS)
500.0000 mL | Freq: Once | INTRAVENOUS | Status: AC
  Administered 2013-10-12: 500 mL via INTRAVENOUS

## 2013-10-12 MED ORDER — DEXTROSE-NACL 5-0.45 % IV SOLN: INTRAVENOUS | Status: DC

## 2013-10-12 MED ORDER — ACETAMINOPHEN 325 MG PO TABS: 650.0000 mg | ORAL_TABLET | Freq: Four times a day (QID) | ORAL | Status: DC | PRN

## 2013-10-12 MED ORDER — AMITRIPTYLINE HCL 10 MG PO TABS
20.0000 mg | ORAL_TABLET | Freq: Every day | ORAL | Status: DC
  Administered 2013-10-12 – 2013-10-14 (×3): 20 mg via ORAL
  Filled 2013-10-12 (×4): qty 2

## 2013-10-12 MED ORDER — CEFTRIAXONE SODIUM 1 G IJ SOLR
1.0000 g | Freq: Once | INTRAMUSCULAR | Status: AC
  Administered 2013-10-12: 15:00:00 via INTRAVENOUS
  Filled 2013-10-12: qty 10

## 2013-10-12 MED ORDER — ISOSORBIDE DINITRATE 30 MG PO TABS
30.0000 mg | ORAL_TABLET | Freq: Three times a day (TID) | ORAL | Status: DC
  Administered 2013-10-12 – 2013-10-15 (×9): 30 mg via ORAL
  Filled 2013-10-12 (×10): qty 1

## 2013-10-12 MED ORDER — HYDRALAZINE HCL 25 MG PO TABS
37.5000 mg | ORAL_TABLET | Freq: Three times a day (TID) | ORAL | Status: DC
  Administered 2013-10-12 – 2013-10-13 (×2): 37.5 mg via ORAL
  Administered 2013-10-13: 11:00:00 via ORAL
  Administered 2013-10-13 – 2013-10-15 (×6): 37.5 mg via ORAL
  Filled 2013-10-12 (×10): qty 1.5

## 2013-10-12 MED ORDER — ALBUTEROL SULFATE HFA 108 (90 BASE) MCG/ACT IN AERS
2.0000 | INHALATION_SPRAY | Freq: Four times a day (QID) | RESPIRATORY_TRACT | Status: DC | PRN
  Filled 2013-10-12: qty 6.7

## 2013-10-12 MED ORDER — DEXTROSE 5 % IV SOLN
1.0000 g | INTRAVENOUS | Status: DC
  Administered 2013-10-13 – 2013-10-14 (×2): 1 g via INTRAVENOUS
  Filled 2013-10-12 (×3): qty 10

## 2013-10-12 MED ORDER — INSULIN REGULAR BOLUS VIA INFUSION
0.0000 [IU] | Freq: Three times a day (TID) | INTRAVENOUS | Status: DC
  Filled 2013-10-12: qty 10

## 2013-10-12 MED ORDER — SODIUM CHLORIDE 0.9 % IV SOLN: INTRAVENOUS | Status: DC

## 2013-10-12 MED ORDER — ONDANSETRON HCL 4 MG/2ML IJ SOLN: 4.0000 mg | Freq: Four times a day (QID) | INTRAMUSCULAR | Status: DC | PRN

## 2013-10-12 MED ORDER — HEPARIN SODIUM (PORCINE) 5000 UNIT/ML IJ SOLN
5000.0000 [IU] | Freq: Three times a day (TID) | INTRAMUSCULAR | Status: DC
  Administered 2013-10-12 – 2013-10-15 (×9): 5000 [IU] via SUBCUTANEOUS
  Filled 2013-10-12 (×11): qty 1

## 2013-10-12 MED ORDER — ALBUTEROL SULFATE (5 MG/ML) 0.5% IN NEBU: 2.5000 mg | INHALATION_SOLUTION | RESPIRATORY_TRACT | Status: DC | PRN

## 2013-10-12 MED ORDER — ONDANSETRON HCL 4 MG PO TABS: 4.0000 mg | ORAL_TABLET | Freq: Four times a day (QID) | ORAL | Status: DC | PRN

## 2013-10-12 MED ORDER — SODIUM CHLORIDE 0.9 % IV SOLN
INTRAVENOUS | Status: DC
  Administered 2013-10-12 – 2013-10-13 (×3): via INTRAVENOUS

## 2013-10-12 NOTE — ED Notes (Signed)
Report called to troyce rn on 4 e

## 2013-10-12 NOTE — ED Notes (Addendum)
Pt brought here by daughter for weakness and "high" cbg reading.  Family states that cardiologist increased lasix at the beginning of the month, and pt has been feeling increasingly weak. Also state that "Dr C" at [facility] and V states she needs a new Visual merchandiser.   CBG >600.

## 2013-10-12 NOTE — ED Notes (Signed)
Pt does not regularly take her insulin.

## 2013-10-12 NOTE — Telephone Encounter (Signed)
She may not need any more furosemide because the very high glucose levels are acting as a diuretic. Have her stop the furosemide and monitor her weight every day. Start the furosemide back once her blood sugars are well controlled and if they notice her weight increases more than 3 pounds over today's weight.

## 2013-10-12 NOTE — ED Notes (Signed)
Checked patient cbg it read Hi notified RN Thayer Ohm of high blood sugar

## 2013-10-12 NOTE — Progress Notes (Signed)
1900 Pt resting comfortably . No apparent distress . On glucostabliizer . No hypo/hyperglycemia. Endorsed

## 2013-10-12 NOTE — ED Notes (Signed)
Admitting doctor at the bedside 

## 2013-10-12 NOTE — Progress Notes (Signed)
Reason for Consult: CHF Referring Physician: TR[facility]   HPI: The patient is a 72 year old Caucasian female with a history of tobacco abuse, apparently quit 10-20 years ago, diabetes mellitus uncontrolled, hypertension, nonischemic cardiomyopathy, cardiac arrest in 2006, Medtronic permanent pacemaker in 2006, hyperlipidemia, chronic renal insufficiency stage III. The patient has recently moved from [place] and her cardiologist was apparently Dr. Duwayne Heck. She had a 2D echocardiogram performed in June 2013 which showed an ejection fraction of 20% or less and she had severe global hypokinesis. The left atrium was mildly dilated, measuring 4.4 cm. The pulmonary systolic pressures were 60-65 mmHg with mild tricuspid regurgitation. There was no indication from her  [place] record that she had a prior ischemic workup. 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 It is a Medtronic device, atrial lead model number is 5076, the right ventricle lead is 5076 as well.  We were consulted to see her in Dec. 2013, during a hospitalization for CHF. She was treated with IV diuretics. Lexiscan myoview was scheduled and completed at that time. Results revealed and EF of 23% with global hypokinesis and no inducible ischemia.  She presents  to the [facility] ER for evaluation, due to high blood glucose levels. She states that her daughter made her come in for treatment. The patient is w/o any complaints or symptoms. She denies SOB and chest pain. Her BNP from yesterday is mildly elevated at 435.1. CXR unremarkable. She has slight JVD but no significant peripheral edema. Lungs are clear and no LEE. Her DM is poorly controlled. BG levels are severely elevated into the 700s. She has not been compliant with her meds.    Past Medical History  Diagnosis Date  . Hypertension   . CHF (congestive heart failure)   . Hyperlipidemia   . Nonischemic cardiomyopathy     Hattie Perch 11/20/2012  . ICD (implantable cardiac  defibrillator) in place 2006  . Myocardial infarction 2006  . Pacemaker 2006  . Cardiac arrest 2006     etiology unknown, s/p dual chamber pacemaker & not AICD for ? 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 Complete heart block   . Shortness of breath     "laying down" (11/20/2012)  . Type II diabetes mellitus   . Arthritis     "fingers" (11/20/2012)  . Depression     "lost alot of family this year" (11/20/2012)  . Chronic renal insufficiency, stage III (moderate)     Etiology unknown  . CKD (chronic kidney disease) stage 3, GFR 30-59 ml/min 11/20/2012    Past Surgical History  Procedure Laterality Date  . Tubal ligation  1970's  . Insert / replace / remove pacemaker  2006    Dual chamber pacemaker, for secondary prevention cardiac arrest  . Cardiac defibrillator placement  2006    Family History  Problem Relation Age of Onset  . Heart disease Brother   . Diabetes Mother   . Diabetes Father     Social History:  reports that she has quit smoking. Her smoking use included Cigarettes. She has a 90 pack-year smoking history. She has never used smokeless tobacco. She reports that she does not drink alcohol or use illicit drugs.  Allergies:  Allergies  Allergen Reactions  . Codeine Nausea Only  . Statins Diarrhea    Medications:  Prior to Admission medications   Medication Sig Start Date End Date Taking? Authorizing Provider  albuterol (PROVENTIL HFA;VENTOLIN HFA) 108 (90 BASE) MCG/ACT inhaler Inhale 2 puffs into the  lungs every 6 (six) hours as needed. For wheezing   Yes Historical Provider, MD  amitriptyline (ELAVIL) 10 MG tablet Take 20 mg by mouth at bedtime.  08/13/13  Yes Historical Provider, MD  furosemide (LASIX) 40 MG tablet Take 1 tablet (40 mg total) by mouth daily. 10/11/13  Yes Shi Blankenship, MD  hydrALAZINE (APRESOLINE) 25 MG tablet Take 1.5 tablets (37.5 mg total) by mouth 3 (three) times daily. 11/22/12  Yes Wilburt Finlay, PA-C  insulin glargine (LANTUS) 100 UNIT/ML injection Inject  30-35 Units into the skin 2 (two) times daily. Take 30 units in the morning or 35 units in the evening   Yes Historical Provider, MD  isosorbide dinitrate (ISORDIL) 10 MG tablet Take 3 tablets (30 mg total) by mouth 3 (three) times daily. 09/21/13  Yes Peytin Dechert, MD  metoprolol tartrate (LOPRESSOR) 25 MG tablet Take 25 mg by mouth 2 (two) times daily.   Yes Historical Provider, MD  naproxen sodium (ANAPROX) 220 MG tablet Take 440 mg by mouth daily as needed. For pain   Yes Historical Provider, MD  sitaGLIPtin (JANUVIA) 50 MG tablet Take 50 mg by mouth daily.   Yes Historical Provider, MD  spironolactone (ALDACTONE) 25 MG tablet Take 1 tablet (25 mg total) by mouth daily. 09/21/13  Yes Thurmon Fair, MD     Results for orders placed during the hospital encounter of 10/12/13 (from the past 48 hour(s))  GLUCOSE, CAPILLARY     Status: Abnormal   Collection Time    10/12/13 12:28 PM      Result Value Range   Glucose-Capillary >600 (*) 70 - 99 mg/dL  COMPREHENSIVE METABOLIC PANEL     Status: Abnormal   Collection Time    10/12/13 12:30 PM      Result Value Range   Sodium 123 (*) 135 - 145 mEq/L   Potassium 4.5  3.5 - 5.1 mEq/L   Chloride 86 (*) 96 - 112 mEq/L   CO2 23  19 - 32 mEq/L   Glucose, Bld 666 (*) 70 - 99 mg/dL   Comment: CRITICAL RESULT CALLED TO, READ BACK BY AND VERIFIED WITH:     A KYKER,RN AT 1357 10/12/13 BY K BARR   BUN 69 (*) 6 - 23 mg/dL   Creatinine, Ser 1.61 (*) 0.50 - 1.10 mg/dL   Calcium 09.6  8.4 - 04.5 mg/dL   Total Protein 8.6 (*) 6.0 - 8.3 g/dL   Albumin 3.6  3.5 - 5.2 g/dL   AST 16  0 - 37 U/L   ALT 17  0 - 35 U/L   Alkaline Phosphatase 148 (*) 39 - 117 U/L   Total Bilirubin 0.6  0.3 - 1.2 mg/dL   GFR calc non Af Amer 29 (*) >90 mL/min   GFR calc Af Amer 34 (*) >90 mL/min   Comment: (NOTE)     The eGFR has been calculated using the CKD EPI equation.     This calculation has not been validated in all clinical situations.     eGFR's persistently <90  mL/min signify possible Chronic Kidney     Disease.  CBC     Status: Abnormal   Collection Time    10/12/13 12:30 PM      Result Value Range   WBC 9.3  4.0 - 10.5 K/uL   RBC 5.47 (*) 3.87 - 5.11 MIL/uL   Hemoglobin 15.2 (*) 12.0 - 15.0 g/dL   HCT 40.9  81.1 - 91.4 %   MCV  77.5 (*) 78.0 - 100.0 fL   MCH 27.8  26.0 - 34.0 pg   MCHC 35.8  30.0 - 36.0 g/dL   RDW 16.1  09.6 - 04.5 %   Platelets 320  150 - 400 K/uL  URINALYSIS, ROUTINE W REFLEX MICROSCOPIC     Status: Abnormal   Collection Time    10/12/13  1:05 PM      Result Value Range   Color, Urine YELLOW  YELLOW   APPearance CLOUDY (*) CLEAR   Specific Gravity, Urine 1.026  1.005 - 1.030   pH 6.0  5.0 - 8.0   Glucose, UA >1000 (*) NEGATIVE mg/dL   Hgb urine dipstick MODERATE (*) NEGATIVE   Bilirubin Urine NEGATIVE  NEGATIVE   Ketones, ur NEGATIVE  NEGATIVE mg/dL   Protein, ur 409 (*) NEGATIVE mg/dL   Urobilinogen, UA 0.2  0.0 - 1.0 mg/dL   Nitrite NEGATIVE  NEGATIVE   Leukocytes, UA MODERATE (*) NEGATIVE  URINE MICROSCOPIC-ADD ON     Status: Abnormal   Collection Time    10/12/13  1:05 PM      Result Value Range   Squamous Epithelial / LPF RARE  RARE   WBC, UA TOO NUMEROUS TO COUNT  <3 WBC/hpf   RBC / HPF 3-6  <3 RBC/hpf   Bacteria, UA FEW (*) RARE  POCT I-STAT 3, BLOOD GAS (G3P V)     Status: Abnormal   Collection Time    10/12/13  1:34 PM      Result Value Range   pH, Ven 7.418 (*) 7.250 - 7.300   pCO2, Ven 37.3 (*) 45.0 - 50.0 mmHg   pO2, Ven 43.0  30.0 - 45.0 mmHg   Bicarbonate 24.1 (*) 20.0 - 24.0 mEq/L   TCO2 25  0 - 100 mmol/L   O2 Saturation 80.0     Sample type VENOUS    BASIC METABOLIC PANEL     Status: Abnormal   Collection Time    10/12/13  3:43 PM      Result Value Range   Sodium 128 (*) 135 - 145 mEq/L   Potassium 4.6  3.5 - 5.1 mEq/L   Chloride 91 (*) 96 - 112 mEq/L   CO2 26  19 - 32 mEq/L   Glucose, Bld 542 (*) 70 - 99 mg/dL   BUN 65 (*) 6 - 23 mg/dL   Creatinine, Ser 8.11 (*) 0.50 - 1.10  mg/dL   Calcium 9.3  8.4 - 91.4 mg/dL   GFR calc non Af Amer 32 (*) >90 mL/min   GFR calc Af Amer 37 (*) >90 mL/min   Comment: (NOTE)     The eGFR has been calculated using the CKD EPI equation.     This calculation has not been validated in all clinical situations.     eGFR's persistently <90 mL/min signify possible Chronic Kidney     Disease.  GLUCOSE, CAPILLARY     Status: Abnormal   Collection Time    10/12/13  3:49 PM      Result Value Range   Glucose-Capillary >600 (*) 70 - 99 mg/dL    Dg Chest Portable 1 View  10/12/2013   CLINICAL DATA:  72 year old female with weakness, shortness of breath and fatigue. Initial encounter.  EXAM: PORTABLE CHEST - 1 VIEW  COMPARISON:  09/12/2013 and earlier.  FINDINGS: Portable AP upright view at 1340 hrs. Interval resolved small pleural effusions. Stable cardiomegaly and mediastinal contours. Stable left chest pacemaker. Visualized tracheal  air column is within normal limits. No pneumothorax or pulmonary edema. No confluent pulmonary opacity.  IMPRESSION: No acute cardiopulmonary abnormality. Interval resolved small pleural effusions.   Electronically Signed   By: Augusto Gamble M.D.   On: 10/12/2013 14:00    Review of Systems  Constitutional: Positive for weight loss.  Respiratory: Negative for shortness of breath.   Cardiovascular: Negative for chest pain, orthopnea, claudication and leg swelling.  All other systems reviewed and are negative.   Blood pressure 148/74, pulse 92, resp. rate 18, height 5\' 3"  (1.6 m), weight 137 lb 12.8 oz (62.506 kg), SpO2 97.00%. Physical Exam  Constitutional: She is oriented to person, place, and time. She appears well-developed and well-nourished. She appears distressed.  Neck: JVD present.  Cardiovascular: Normal rate, regular rhythm, normal heart sounds and intact distal pulses.  Exam reveals no gallop and no friction rub.   No murmur heard. Pulses:      Radial pulses are 2+ on the right side, and 2+ on the  left side.       Dorsalis pedis pulses are 2+ on the right side, and 2+ on the left side.  Respiratory: Effort normal and breath sounds normal. No respiratory distress. She has no wheezes. She has no rales.  GI: Soft. Bowel sounds are normal. She exhibits no distension and no mass. There is no tenderness.  Musculoskeletal: She exhibits no edema.  Neurological: She is alert and oriented to person, place, and time.  Skin: Skin is warm and dry. She is not diaphoretic.  Psychiatric: She has a normal mood and affect. Her behavior is normal.    Assessment/Plan: Principal Problem:   Diabetic hyperosmolar non-ketotic state Active Problems:   Hypertension   Complete heart block s/p dual chamber Medtronic pacmaker 2006   Cardiomyopathy, (? ischemic AS AK on echo), EF 25%   CKD (chronic kidney disease) stage 3, GFR 30-59 ml/min   Dehydration   Acute renal failure   Hyponatremia  Plan:  We were asked to see for CHF. She does not exhibit any overt signs of volume overload. No SOB. O2 stats are 99% on RA. CXR unremarkable. BNP slightly up at 435. Dr. Royann Shivers provided recommendations today, via our office triage nurse in regards to her diuretics. His recs are as follows : "She may not need any more furosemide because the very high glucose levels are acting as a diuretic. Have her stop the furosemide and monitor her weight every day. Start the furosemide back once her blood sugars are well controlled and if they notice her weight increases more than 3 pounds over today's weight". We will let [facility] admit and treat her DM. Call if additional input is needed. MD to follow.  Allayne Butcher, PA-C 10/12/2013, 4:32 PM   I have seen and examined the patient along with Roxy Horseman SIMMONS, PA-C.  I have reviewed the chart, notes and new data.  I agree with PA/NP's note.  PLAN: She presents with decompensated DM and hypovolemia due to glucosuria. She is not in CHF. She does have severely depressed LV  function, cardiomyopathy of uncertain etiology, partly due to obligatory RV pacing secondary to complete AV block. At her office visit we discussed options to improve symptoms and outcomes by upgrading to a BiV device (either CRT-D or CRT-P). I also recommended an echo for a diastolic murmur that I could not explain. - will get echo while she is here.  She requires rehydration, but have to avoid overdoing it and  making her hypervolemic/CHF.  Thurmon Fair, MD, [facility] [phone] 10/12/2013, 7:03 PM

## 2013-10-12 NOTE — Telephone Encounter (Signed)
Critical Lab: Glucose 731.  Repeated and verified.  Call to daughter, Suzette Battiest.  Asked if pt was fasting before labs.  Stated pt had half of a croissant sandwich and milk.  Informed of critical lab.  Daughter stated she is aware pt's sugars have been high.  Stated pt has not been taking her insulin (lantus).  Also stated PCP rx'd Januvia and pt has not started it b/c she is scared.  Informed daughter that pt's symptoms are likely coming from her elevated BG, in addition to dehydration.    Dr. Rennis Golden notified as Dr. Royann Shivers out of the office today.  Advised pt contact PCP about BG for advice.  Daughter informed and verbalized understanding.  Stated pt is weak and she needs to help her so much.  Stated she thinks pt is getting depressed b/c "she feels like she's losing everything."  Stated that's why she thinks pt needs HH.  Informed daughter that she should contact PCP r/t BG and pt's condition.  Informed PCP can set up [facility] services for DM mgmt if necessary to help make sure pt is compliant and educate them on DM.  Daughter verbalized understanding and stated she will call PCP now.  Informed results are not in yet, but PCP can call office to get results b/c they should be reported soon.  Verbalized understanding and agreed w/ plan.

## 2013-10-12 NOTE — ED Notes (Signed)
Insulin drip started at 5.4 verified by j davis rn

## 2013-10-12 NOTE — H&P (Signed)
History and Physical       Hospital Admission Note Date: 10/12/2013  Patient name: [name] [name] Medical record number: [id number] Date of birth: 02/20/1941 Age: 72 y.o. Gender: female PCP: Doreatha Martin, MD  Primary cardiologist: Dr Royann Shivers  Chief Complaint:  Weakness  HPI: Patient is a 72 year old man female with history of chronic CHF, EF 25-30% by echo in 09/2012, insulin-dependent diabetes mellitus, complete heart block status post pacemaker, hypertension, hyperlipidemia presented to the ER with progressive weakness over the last 2-3 weeks. Patient reported that she was in the ER last month and was having shortness of breath, patient received IV Lasix, was DC'd from the ER and her Lasix dose was increased. She subsequently saw her cardiologist, Dr. Royann Shivers. Per Dr. Royann Shivers, she was not receiving maximal medical therapy and recommended to gradually increase CHF medications, repeat echo. Patient also apparently has problem with compliance with medications. She reported that she has not been taking her insulin and over the last 1 week, she was not eating well when she stopped taking her insulin. She however continued to take Lasix and other CHF medications.  ER workup showed sodium of 123, creatinine of 1.6 (yesterday was 1.8), glucose of 666. Patient was started on IV insulin drip in the ER.  Review of Systems:  Constitutional: Denies fever, chills, diaphoresis, poor appetite and fatigue.  HEENT: Denies photophobia, eye pain, redness, hearing loss, ear pain, congestion, sore throat, rhinorrhea, sneezing, mouth sores, trouble swallowing, neck pain, neck stiffness and tinnitus.   Respiratory: Denies SOB, DOE, cough, chest tightness,  and wheezing.   Cardiovascular: Please see history of present illness Gastrointestinal: + Loss of appetite otherwise denies nausea, vomiting, abdominal pain, diarrhea, constipation, blood in stool and  abdominal distention.  Genitourinary: Patient reports increased urination, dysuria Musculoskeletal: Denies myalgias, back pain, joint swelling, arthralgias and gait problem.  Skin: Denies pallor, rash and wound.  Neurological: + Generalized weakness and lightheadedness, denies any seizures or syncopal episode, numbness or any headaches Hematological: Denies adenopathy. Easy bruising, personal or family bleeding history  Psychiatric/Behavioral: Denies suicidal ideation, mood changes, confusion, nervousness, sleep disturbance and agitation  Past Medical History: Past Medical History  Diagnosis Date  . Hypertension   . CHF (congestive heart failure)   . Hyperlipidemia   . Nonischemic cardiomyopathy     Hattie Perch 11/20/2012  . ICD (implantable cardiac defibrillator) in place 2006  . Myocardial infarction 2006  . Pacemaker 2006  . Cardiac arrest 2006     etiology unknown, s/p dual chamber pacemaker & not AICD for ? 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 Complete heart block   . Shortness of breath     "laying down" (11/20/2012)  . Type II diabetes mellitus   . Arthritis     "fingers" (11/20/2012)  . Depression     "lost alot of family this year" (11/20/2012)  . Chronic renal insufficiency, stage III (moderate)     Etiology unknown  . CKD (chronic kidney disease) stage 3, GFR 30-59 ml/min 11/20/2012   Past Surgical History  Procedure Laterality Date  . Tubal ligation  1970's  . Insert / replace / remove pacemaker  2006    Dual chamber pacemaker, for secondary prevention cardiac arrest  . Cardiac defibrillator placement  2006    Medications: Prior to Admission medications   Medication Sig Start Date End Date Taking? Authorizing Provider  albuterol (PROVENTIL HFA;VENTOLIN HFA) 108 (90 BASE) MCG/ACT inhaler Inhale 2 puffs into the lungs every 6 (six) hours as needed. For wheezing  Yes Historical Provider, MD  amitriptyline (ELAVIL) 10 MG tablet Take 20 mg by mouth at bedtime.  08/13/13  Yes Historical  Provider, MD  furosemide (LASIX) 40 MG tablet Take 1 tablet (40 mg total) by mouth daily. 10/11/13  Yes Mihai Croitoru, MD  hydrALAZINE (APRESOLINE) 25 MG tablet Take 1.5 tablets (37.5 mg total) by mouth 3 (three) times daily. 11/22/12  Yes Wilburt Finlay, PA-C  insulin glargine (LANTUS) 100 UNIT/ML injection Inject 30-35 Units into the skin 2 (two) times daily. Take 30 units in the morning or 35 units in the evening   Yes Historical Provider, MD  isosorbide dinitrate (ISORDIL) 10 MG tablet Take 3 tablets (30 mg total) by mouth 3 (three) times daily. 09/21/13  Yes Mihai Croitoru, MD  metoprolol tartrate (LOPRESSOR) 25 MG tablet Take 25 mg by mouth 2 (two) times daily.   Yes Historical Provider, MD  naproxen sodium (ANAPROX) 220 MG tablet Take 440 mg by mouth daily as needed. For pain   Yes Historical Provider, MD  sitaGLIPtin (JANUVIA) 50 MG tablet Take 50 mg by mouth daily.   Yes Historical Provider, MD  spironolactone (ALDACTONE) 25 MG tablet Take 1 tablet (25 mg total) by mouth daily. 09/21/13  Yes Thurmon Fair, MD    Allergies:   Allergies  Allergen Reactions  . Codeine Nausea Only  . Statins Diarrhea    Social History:  reports that she has quit smoking. Her smoking use included Cigarettes. She has a 90 pack-year smoking history. She has never used smokeless tobacco. She reports that she does not drink alcohol or use illicit drugs.  Family History: Family History  Problem Relation Age of Onset  . Heart disease Brother   . Diabetes Mother   . Diabetes Father     Physical Exam: Blood pressure 148/74, pulse 92, resp. rate 18, height 5\' 3"  (1.6 m), weight 62.506 kg (137 lb 12.8 oz), SpO2 97.00%. General: Alert, awake, oriented x3, in no acute distress. HEENT: normocephalic, atraumatic, anicteric sclera, pink conjunctiva, pupils equal and reactive to light and accomodation, oropharynx clear, appears dehydrated, dry mucous membranes Neck: supple, no masses or lymphadenopathy, no goiter,  no bruits  Heart: Regular rate and rhythm, without murmurs, rubs or gallops. Lungs: Clear to auscultation bilaterally, no wheezing, rales or rhonchi. Abdomen: Soft, nontender, nondistended, positive bowel sounds, no masses. Extremities: No clubbing, cyanosis or edema with positive pedal pulses. Neuro: Grossly intact, no focal neurological deficits, strength 5/5 upper and lower extremities bilaterally Psych: alert and oriented x 3, normal mood and affect Skin: no rashes or lesions, warm and dry   LABS on Admission:  Basic Metabolic Panel:  Recent Labs Lab 10/11/13 1108 10/12/13 1230  NA 122* 123*  K 5.5* 4.5  CL 88* 86*  CO2 29 23  GLUCOSE 731* 666*  BUN 63* 69*  CREATININE 1.80* 1.66*  CALCIUM 9.5 10.1   Liver Function Tests:  Recent Labs Lab 10/11/13 1108 10/12/13 1230  AST 16 16  ALT 16 17  ALKPHOS 130* 148*  BILITOT 0.7 0.6  PROT 7.3 8.6*  ALBUMIN 3.5 3.6   No results found for this basename: LIPASE, AMYLASE,  in the last 168 hours No results found for this basename: AMMONIA,  in the last 168 hours CBC:  Recent Labs Lab 10/12/13 1230  WBC 9.3  HGB 15.2*  HCT 42.4  MCV 77.5*  PLT 320   Cardiac Enzymes: No results found for this basename: CKTOTAL, CKMB, CKMBINDEX, TROPONINI,  in the last 168 hours  BNP: No components found with this basename: POCBNP,  CBG:  Recent Labs Lab 10/12/13 1228  GLUCAP >600*     Radiological Exams on Admission: Dg Chest Portable 1 View  10/12/2013   CLINICAL DATA:  72 year old female with weakness, shortness of breath and fatigue. Initial encounter.  EXAM: PORTABLE CHEST - 1 VIEW  COMPARISON:  09/12/2013 and earlier.  FINDINGS: Portable AP upright view at 1340 hrs. Interval resolved small pleural effusions. Stable cardiomegaly and mediastinal contours. Stable left chest pacemaker. Visualized tracheal air column is within normal limits. No pneumothorax or pulmonary edema. No confluent pulmonary opacity.  IMPRESSION: No acute  cardiopulmonary abnormality. Interval resolved small pleural effusions.   Electronically Signed   By: Augusto Gamble M.D.   On: 10/12/2013 14:00    Assessment/Plan Principal Problem:   Diabetic hyperosmolar non-ketotic state with pseudohyponatremia, dehydration - Will start patient on IV glucose stabilizer, gentle hydration until the blood sugars are improved (the patient has a history of systolic CHF with EF of 25%) hence avoid aggressive IV fluids - Hold Lasix, spironolactone, obtain serial BMET once blood sugars are in acceptable range, <180, will place on subcutaneous insulin     Active Problems:   Hypertension: - Continue hydralazine,  Lopressor, isosorbide     Complete heart block s/p dual chamber Medtronic pacmaker 2006, Cardiomyopathy, (? ischemic AS AK on echo), EF 25% - Patient has a pacemaker, per Dr Royann Shivers, decision was being made about upgrading the pacemaker and possibly cardiac resynchronization therapy /ICD.  - Cardiology consult has been placed   urinary tract infection  - Obtain urine culture, started on Rocephin     Acute renal failure On chronic kidney disease stage III : Likely worsened due to Lasix, uncontrolled diabetic state, UTI   DVT prophylaxis:  heparin subcutaneous   CODE STATUS:  Full code  Family Communication: Admission, patients condition and plan of care including tests being ordered have been discussed with the patient  who indicates understanding and agree with the plan and Code Status   Further plan will depend as patient's clinical course evolves and further radiologic and laboratory data become available.   Time Spent on Admission: 1 hour  Charese Abundis M.D. [facility] 10/12/2013, 3:49 PM Pager: [phone]  If 7PM-7AM, please contact night-coverage [email or web address] Password [email or web address]

## 2013-10-12 NOTE — ED Provider Notes (Signed)
CSN: [id number]     Arrival date & time 10/12/13  1212 History   First MD Initiated Contact with Patient 10/12/13 1245     Chief Complaint  Patient presents with  . Weakness  . Hyperglycemia   (Consider location/radiation/quality/duration/timing/severity/associated sxs/prior Treatment) HPI Comments: 72 year old female presents with progressive weakness over the past 3 weeks. This started after she had shortness of breath and is at the ER and was told to increase her Lasix by her cardiologist. She continued 1-1/2 of her 40 mg Lasix since then. She's been losing about 16 pounds over this time. The daughter is concerned because she feels the patient is likely dehydrated. The patient's been using the bathroom more often and drinking more often. The patient has been having high glucoses and has been not taking her insulin as directed. She was recently treated for supported. She was to be started on Januvia but has not started yet. Her labs drawn by her cardiologist yesterday and was told that she may need to be admitted. At this point the patient and the daughter feels she is too weak to even stand.   Past Medical History  Diagnosis Date  . Hypertension   . CHF (congestive heart failure)   . Hyperlipidemia   . Nonischemic cardiomyopathy     Hattie Perch 11/20/2012  . ICD (implantable cardiac defibrillator) in place 2006  . Myocardial infarction 2006  . Pacemaker 2006  . Cardiac arrest 2006     etiology unknown, s/p dual chamber pacemaker & not AICD for ? 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 Complete heart block   . Shortness of breath     "laying down" (11/20/2012)  . Type II diabetes mellitus   . Arthritis     "fingers" (11/20/2012)  . Depression     "lost alot of family this year" (11/20/2012)  . Chronic renal insufficiency, stage III (moderate)     Etiology unknown  . CKD (chronic kidney disease) stage 3, GFR 30-59 ml/min 11/20/2012   Past Surgical History  Procedure Laterality Date  . Tubal ligation   1970's  . Insert / replace / remove pacemaker  2006    Dual chamber pacemaker, for secondary prevention cardiac arrest  . Cardiac defibrillator placement  2006   Family History  Problem Relation Age of Onset  . Heart disease Brother   . Diabetes Mother   . Diabetes Father    History  Substance Use Topics  . Smoking status: Former Smoker -- 2.00 packs/day for 45 years    Types: Cigarettes  . Smokeless tobacco: Never Used     Comment: 11/20/2012 "quit smoking in the 1990's"  . Alcohol Use: No     Comment: 11/20/2012 "used to drink quite a bit; quit drinking ~ 1990's"    OB History   Grav Para Term Preterm Abortions TAB SAB Ect Mult Living                 Review of Systems  Constitutional: Positive for fatigue. Negative for fever.  Respiratory: Negative for shortness of breath.   Cardiovascular: Negative for chest pain and leg swelling.  Gastrointestinal: Negative for vomiting and abdominal pain.  Endocrine: Positive for polydipsia and polyuria.  Genitourinary: Positive for frequency. Negative for dysuria.  Neurological: Positive for weakness.  All other systems reviewed and are negative.    Allergies  Codeine and Statins  Home Medications   Current Outpatient Rx  Name  Route  Sig  Dispense  Refill  . albuterol (PROVENTIL HFA;VENTOLIN  HFA) 108 (90 BASE) MCG/ACT inhaler   Inhalation   Inhale 2 puffs into the lungs every 6 (six) hours as needed. For wheezing         . amitriptyline (ELAVIL) 10 MG tablet   Oral   Take 20 mg by mouth at bedtime.          . furosemide (LASIX) 40 MG tablet   Oral   Take 1 tablet (40 mg total) by mouth daily.   30 tablet   0   . hydrALAZINE (APRESOLINE) 25 MG tablet   Oral   Take 1.5 tablets (37.5 mg total) by mouth 3 (three) times daily.   150 tablet   5   . insulin glargine (LANTUS) 100 UNIT/ML injection   Subcutaneous   Inject 30-35 Units into the skin 2 (two) times daily. Take 30 units in the morning or 35 units in the  evening         . isosorbide dinitrate (ISORDIL) 10 MG tablet   Oral   Take 3 tablets (30 mg total) by mouth 3 (three) times daily.   90 tablet   5   . metoprolol tartrate (LOPRESSOR) 25 MG tablet   Oral   Take 25 mg by mouth 2 (two) times daily.         . naproxen sodium (ANAPROX) 220 MG tablet   Oral   Take 440 mg by mouth daily as needed. For pain         . sitaGLIPtin (JANUVIA) 50 MG tablet   Oral   Take 50 mg by mouth daily.         Marland Kitchen spironolactone (ALDACTONE) 25 MG tablet   Oral   Take 1 tablet (25 mg total) by mouth daily.   30 tablet   5    BP 146/69  Pulse 90  Resp 18  Ht 5\' 3"  (1.6 m)  Wt 137 lb 12.8 oz (62.506 kg)  BMI 24.42 kg/m2  SpO2 96% Physical Exam  Nursing note and vitals reviewed. Constitutional: She is oriented to person, place, and time. She appears well-developed and well-nourished.  HENT:  Head: Normocephalic and atraumatic.  Right Ear: External ear normal.  Left Ear: External ear normal.  Nose: Nose normal.  Mouth/Throat: Mucous membranes are dry.  Eyes: Right eye exhibits no discharge. Left eye exhibits no discharge.  Cardiovascular: Normal rate, regular rhythm and normal heart sounds.   Pulmonary/Chest: Effort normal and breath sounds normal.  Abdominal: Soft. She exhibits no distension. There is no tenderness.  Musculoskeletal: She exhibits no edema and no tenderness.  Neurological: She is alert and oriented to person, place, and time.  Skin: Skin is warm and dry.    ED Course  Procedures (including critical care time) Labs Review Labs Reviewed  GLUCOSE, CAPILLARY - Abnormal; Notable for the following:    Glucose-Capillary >600 (*)    All other components within normal limits  COMPREHENSIVE METABOLIC PANEL - Abnormal; Notable for the following:    Sodium 123 (*)    Chloride 86 (*)    Glucose, Bld 666 (*)    BUN 69 (*)    Creatinine, Ser 1.66 (*)    Total Protein 8.6 (*)    Alkaline Phosphatase 148 (*)    GFR calc  non Af Amer 29 (*)    GFR calc Af Amer 34 (*)    All other components within normal limits  URINALYSIS, ROUTINE W REFLEX MICROSCOPIC - Abnormal; Notable for the following:  APPearance CLOUDY (*)    Glucose, UA >1000 (*)    Hgb urine dipstick MODERATE (*)    Protein, ur 100 (*)    Leukocytes, UA MODERATE (*)    All other components within normal limits  CBC - Abnormal; Notable for the following:    RBC 5.47 (*)    Hemoglobin 15.2 (*)    MCV 77.5 (*)    All other components within normal limits  URINE MICROSCOPIC-ADD ON - Abnormal; Notable for the following:    Bacteria, UA FEW (*)    All other components within normal limits  POCT I-STAT 3, BLOOD GAS (G3P V) - Abnormal; Notable for the following:    pH, Ven 7.418 (*)    pCO2, Ven 37.3 (*)    Bicarbonate 24.1 (*)    All other components within normal limits  URINE CULTURE   Imaging Review Dg Chest Portable 1 View  10/12/2013   CLINICAL DATA:  72 year old female with weakness, shortness of breath and fatigue. Initial encounter.  EXAM: PORTABLE CHEST - 1 VIEW  COMPARISON:  09/12/2013 and earlier.  FINDINGS: Portable AP upright view at 1340 hrs. Interval resolved small pleural effusions. Stable cardiomegaly and mediastinal contours. Stable left chest pacemaker. Visualized tracheal air column is within normal limits. No pneumothorax or pulmonary edema. No confluent pulmonary opacity.  IMPRESSION: No acute cardiopulmonary abnormality. Interval resolved small pleural effusions.   Electronically Signed   By: Augusto Gamble M.D.   On: 10/12/2013 14:00    EKG Interpretation    Date/Time:  Friday October 12 2013 12:20:06 EST Ventricular Rate:  87 PR Interval:  180 QRS Duration: 210 QT Interval:  484 QTC Calculation: 582 R Axis:   -64 Text Interpretation:  Atrial-sensed ventricular-paced rhythm with occasional Premature ventricular complexes Abnormal ECG No significant change since last tracing Confirmed by Laury Huizar  MD, Toshiyuki Fredell ([id number]) on  10/12/2013 6:02:10 PM            MDM   1. UTI (urinary tract infection)   2. Hyperglycemia without ketosis    Patient's weakness is likely from dehydration from lasix + hyperglycemia. No evidence of DKA. Given her CHF and CKD history, will give small amount of fluids and put on glucostabilizer. Given that she also has a UTI will cover for rocephin and admit to hospitalist.    Audree Camel, MD 10/12/13 [phone]

## 2013-10-12 NOTE — Telephone Encounter (Signed)
Pt currently in [facility] being evaluated.

## 2013-10-13 DIAGNOSIS — I5022 Chronic systolic (congestive) heart failure: Secondary | ICD-10-CM

## 2013-10-13 DIAGNOSIS — I10 Essential (primary) hypertension: Secondary | ICD-10-CM

## 2013-10-13 DIAGNOSIS — I428 Other cardiomyopathies: Secondary | ICD-10-CM

## 2013-10-13 DIAGNOSIS — E1101 Type 2 diabetes mellitus with hyperosmolarity with coma: Secondary | ICD-10-CM

## 2013-10-13 DIAGNOSIS — E119 Type 2 diabetes mellitus without complications: Secondary | ICD-10-CM

## 2013-10-13 DIAGNOSIS — I059 Rheumatic mitral valve disease, unspecified: Secondary | ICD-10-CM

## 2013-10-13 DIAGNOSIS — E785 Hyperlipidemia, unspecified: Secondary | ICD-10-CM

## 2013-10-13 DIAGNOSIS — I509 Heart failure, unspecified: Secondary | ICD-10-CM

## 2013-10-13 DIAGNOSIS — E860 Dehydration: Secondary | ICD-10-CM

## 2013-10-13 LAB — URINE CULTURE: Colony Count: 6000

## 2013-10-13 LAB — BASIC METABOLIC PANEL
BUN: 56 mg/dL — ABNORMAL HIGH (ref 6–23)
Chloride: 97 mEq/L (ref 96–112)
Creatinine, Ser: 1.67 mg/dL — ABNORMAL HIGH (ref 0.50–1.10)
GFR calc non Af Amer: 29 mL/min — ABNORMAL LOW (ref >90)
Glucose, Bld: 179 mg/dL — ABNORMAL HIGH (ref 70–99)
Potassium: 3.9 mEq/L (ref 3.5–5.1)

## 2013-10-13 LAB — GLUCOSE, CAPILLARY
Glucose-Capillary: 147 mg/dL — ABNORMAL HIGH (ref 70–99)
Glucose-Capillary: 164 mg/dL — ABNORMAL HIGH (ref 70–99)
Glucose-Capillary: 168 mg/dL — ABNORMAL HIGH (ref 70–99)
Glucose-Capillary: 210 mg/dL — ABNORMAL HIGH (ref 70–99)
Glucose-Capillary: 332 mg/dL — ABNORMAL HIGH (ref 70–99)
Glucose-Capillary: 238 mg/dL — ABNORMAL HIGH (ref 70–99)
Glucose-Capillary: 358 mg/dL — ABNORMAL HIGH (ref 70–99)

## 2013-10-13 LAB — CBC
HCT: 40.4 % (ref 36.0–46.0)
Hemoglobin: 14.1 g/dL (ref 12.0–15.0)
MCH: 27.3 pg (ref 26.0–34.0)
MCHC: 34.9 g/dL (ref 30.0–36.0)
RDW: 14 % (ref 11.5–15.5)

## 2013-10-13 LAB — HEMOGLOBIN A1C: Mean Plasma Glucose: 315 mg/dL — ABNORMAL HIGH (ref <117)

## 2013-10-13 MED ORDER — INSULIN GLARGINE 100 UNIT/ML ~~LOC~~ SOLN
20.0000 [IU] | Freq: Every day | SUBCUTANEOUS | Status: DC
  Administered 2013-10-13: 04:00:00 20 [IU] via SUBCUTANEOUS
  Filled 2013-10-13 (×2): qty 0.2

## 2013-10-13 MED ORDER — GLUCERNA SHAKE PO LIQD
237.0000 mL | Freq: Two times a day (BID) | ORAL | Status: DC
  Administered 2013-10-14 – 2013-10-15 (×4): 237 mL via ORAL

## 2013-10-13 MED ORDER — FUROSEMIDE 20 MG PO TABS
20.0000 mg | ORAL_TABLET | Freq: Every day | ORAL | Status: DC
  Administered 2013-10-13 – 2013-10-15 (×3): 20 mg via ORAL
  Filled 2013-10-13 (×4): qty 1

## 2013-10-13 MED ORDER — INSULIN ASPART 100 UNIT/ML ~~LOC~~ SOLN
0.0000 [IU] | Freq: Every day | SUBCUTANEOUS | Status: DC
  Administered 2013-10-13: 5 [IU] via SUBCUTANEOUS
  Administered 2013-10-14: 21:00:00 3 [IU] via SUBCUTANEOUS

## 2013-10-13 MED ORDER — SPIRONOLACTONE 12.5 MG HALF TABLET
12.5000 mg | ORAL_TABLET | Freq: Every day | ORAL | Status: DC
  Administered 2013-10-13 – 2013-10-15 (×3): 12.5 mg via ORAL
  Filled 2013-10-13 (×4): qty 1

## 2013-10-13 MED ORDER — SODIUM CHLORIDE 0.9 % IV SOLN
INTRAVENOUS | Status: DC
  Administered 2013-10-13: 08:00:00 via INTRAVENOUS

## 2013-10-13 MED ORDER — INSULIN ASPART 100 UNIT/ML ~~LOC~~ SOLN
0.0000 [IU] | Freq: Three times a day (TID) | SUBCUTANEOUS | Status: DC
  Administered 2013-10-13: 5 [IU] via SUBCUTANEOUS
  Administered 2013-10-13: 3 [IU] via SUBCUTANEOUS
  Administered 2013-10-13: 16:00:00 11 [IU] via SUBCUTANEOUS
  Administered 2013-10-14: 06:00:00 8 [IU] via SUBCUTANEOUS
  Administered 2013-10-14: 11:00:00 via SUBCUTANEOUS
  Administered 2013-10-14 – 2013-10-15 (×2): 5 [IU] via SUBCUTANEOUS
  Administered 2013-10-15: 12:00:00 8 [IU] via SUBCUTANEOUS

## 2013-10-13 MED ORDER — INSULIN GLARGINE 100 UNIT/ML ~~LOC~~ SOLN
25.0000 [IU] | Freq: Every day | SUBCUTANEOUS | Status: DC
  Administered 2013-10-13: 25 [IU] via SUBCUTANEOUS
  Filled 2013-10-13 (×2): qty 0.25

## 2013-10-13 NOTE — Progress Notes (Signed)
[facility] PROGRESS NOTE  [name] [name] AVW:[id number] DOB: [birth year] DOA: 10/12/2013 PCP: Doreatha Martin, MD  Assessment/Plan: 1-Diabetic hyperosmolar non-ketotic state with pseudohyponatremia: improved and now CBG's in the 160-170. -patient off insulin drip -will continue SSI and lantus -further adjustment to her regimen base on PO intake and CBG's fluctuation -CBG's 12.6 -patient non-compliant with insulin.  2-Dehydration: patient received 24 hrs of IVF's. Now eating and drinking properly. -will change IVF's to [facility] (especially with hx of heart failure) -resume lasix and spironolactone in am at half dose  3-UTI: continue rocephin -follow urine cx  4-HTN: stable. Will continue hydralazine, lopressor and isosorbide   5-Complete heart block s/p dual chamber Medtronic pacmaker 2006, Cardiomyopathy, (? ischemic AS AK on echo), EF 25%: -per cardiology -follow 2-D echo -future plans for ICD/pacemaker upgrades -will follow rec's  6-acute on chronic renal failure: patient with stage III at baseline. -due to dehydration, continue use of nephrotoxic agents and UTI. -improved and back to baseline -will monitor closely -restart lasix and spironolactone at half dose on tomorrow (11/23) -continue tx for UTI -follow renal function trend  WGN:[id number]   Code Status: Full Family Communication: no family at bedisde Disposition Plan: to be determine   Consultants:  Cardiology   Procedures:  2-D echo: pending  Antibiotics:  Rocephin   HPI/Subjective: Feeling better, no nausea, vomiting, abd pain or fever. Having lunch and currently off IV insulin. Denies dysuria  Objective: Filed Vitals:   10/13/13 1401  BP: 109/60  Pulse: 90  Temp: 98 F (36.7 C)  Resp: 19    Intake/Output Summary (Last 24 hours) at 10/13/13 1447 Last data filed at 10/13/13 1401  Gross per 24 hour  Intake 387.45 ml  Output    700 ml  Net -312.55 ml   Filed Weights   10/12/13 1235  10/12/13 1723 10/13/13 0410  Weight: 62.506 kg (137 lb 12.8 oz) 65.1 kg (143 lb 8.3 oz) 65.2 kg (143 lb 11.8 oz)    Exam:   General:  AAOX3; afebrile, feeling better and tolerating diet  Cardiovascular: S1 and S2, positive murmur, no rubs or gallops; no JVD and no LE edema  Respiratory: CTA bilaterally  Abdomen: soft, NT, ND, positive BS  Musculoskeletal: no Le edema, no erythema  Data Reviewed: Basic Metabolic Panel:  Recent Labs Lab 10/11/13 1108 10/12/13 1230 10/12/13 1543 10/12/13 1940 10/13/13 0640  NA 122* 123* 128*  --  132*  K 5.5* 4.5 4.6  --  3.9  CL 88* 86* 91*  --  97  CO2 29 23 26   --  23  GLUCOSE 731* 666* 542*  --  179*  BUN 63* 69* 65*  --  56*  CREATININE 1.80* 1.66* 1.55* 1.55* 1.67*  CALCIUM 9.5 10.1 9.3  --  9.1   Liver Function Tests:  Recent Labs Lab 10/11/13 1108 10/12/13 1230  AST 16 16  ALT 16 17  ALKPHOS 130* 148*  BILITOT 0.7 0.6  PROT 7.3 8.6*  ALBUMIN 3.5 3.6   CBC:  Recent Labs Lab 10/12/13 1230 10/12/13 1940 10/13/13 0640  WBC 9.3 7.1 8.5  HGB 15.2* 13.2 14.1  HCT 42.4 39.5 40.4  MCV 77.5* 77.0* 78.3  PLT 320 298 314   BNP (last 3 results)  Recent Labs  11/19/12 1200 11/21/12 0602 09/12/13 1035  PROBNP 7378.0* 4614.0* 4807.0*   CBG:  Recent Labs Lab 10/13/13 0205 10/13/13 0304 10/13/13 0407 10/13/13 0504 10/13/13 1127  GLUCAP 147* 164* 163* 168* 210*    Recent Results (  from the past 240 hour(s))  URINE CULTURE     Status: None   Collection Time    10/12/13  1:05 PM      Result Value Range Status   Specimen Description URINE, CLEAN CATCH   Final   Special Requests NONE   Final   Culture  Setup Time     Final   Value: 10/12/2013 17:01     Performed at [facility] Count     Final   Value: 6,000 COLONIES/ML     Performed at [facility]   Culture     Final   Value: INSIGNIFICANT GROWTH     Performed at [facility]   Report Status 10/13/2013 FINAL   Final      Studies: Dg Chest Portable 1 View  10/12/2013   CLINICAL DATA:  72 year old female with weakness, shortness of breath and fatigue. Initial encounter.  EXAM: PORTABLE CHEST - 1 VIEW  COMPARISON:  09/12/2013 and earlier.  FINDINGS: Portable AP upright view at 1340 hrs. Interval resolved small pleural effusions. Stable cardiomegaly and mediastinal contours. Stable left chest pacemaker. Visualized tracheal air column is within normal limits. No pneumothorax or pulmonary edema. No confluent pulmonary opacity.  IMPRESSION: No acute cardiopulmonary abnormality. Interval resolved small pleural effusions.   Electronically Signed   By: Augusto Gamble M.D.   On: 10/12/2013 14:00    Scheduled Meds: . amitriptyline  20 mg Oral QHS  . cefTRIAXone (ROCEPHIN)  IV  1 g Intravenous Q24H  . [START ON 10/14/2013] furosemide  20 mg Oral Daily  . heparin  5,000 Units Subcutaneous Q8H  . hydrALAZINE  37.5 mg Oral TID  . insulin aspart  0-15 Units Subcutaneous TID WC  . insulin aspart  0-5 Units Subcutaneous QHS  . insulin glargine  25 Units Subcutaneous QHS  . isosorbide dinitrate  30 mg Oral TID  . metoprolol tartrate  25 mg Oral BID  . sodium chloride  3 mL Intravenous Q12H  . [START ON 10/14/2013] spironolactone  12.5 mg Oral Daily   Continuous Infusions: . sodium chloride 20 mL/hr at 10/13/13 0800     Time spent: >30 minutes   [name] [name]  [facility] Pager [phone]. If 7PM-7AM, please contact night-coverage at [email or web address], password [facility] 10/13/2013, 2:47 PM  LOS: 1 day

## 2013-10-13 NOTE — Progress Notes (Signed)
  Echocardiogram 2D Echocardiogram has been performed.  Nestor Ramp M 10/13/2013, 1:12 PM

## 2013-10-13 NOTE — Progress Notes (Signed)
Subjective: Glucose improved down from >600 on glucomander  Objective: Vital signs in last 24 hours: Temp:  [98.2 F (36.8 C)-98.5 F (36.9 C)] 98.5 F (36.9 C) (11/22 0410) Pulse Rate:  [86-96] 86 (11/22 1111) Resp:  [18-21] 18 (11/22 0410) BP: (108-148)/(56-76) 130/56 mmHg (11/22 1111) SpO2:  [96 %-99 %] 98 % (11/22 0410) Weight:  [143 lb 8.3 oz (65.1 kg)-143 lb 11.8 oz (65.2 kg)] 143 lb 11.8 oz (65.2 kg) (11/22 0410) Weight change:  Last BM Date: 10/11/13 Intake/Output from previous day: +67 11/21 0701 - 11/22 0700 In: 267.5 [P.O.:240; I.V.:27.5] Out: 200 [Urine:200] Intake/Output this shift:   Lab Results:  Recent Labs  10/12/13 1940 10/13/13 0640  WBC 7.1 8.5  HGB 13.2 14.1  HCT 39.5 40.4  PLT 298 314   BMET  Recent Labs  10/12/13 1543 10/12/13 1940 10/13/13 0640  NA 128*  --  132*  K 4.6  --  3.9  CL 91*  --  97  CO2 26  --  23  GLUCOSE 542*  --  179*  BUN 65*  --  56*  CREATININE 1.55* 1.55* 1.67*  CALCIUM 9.3  --  9.1   No results found for this basename: TROPONINI, CK, MB,  in the last 72 hours  Lab Results  Component Value Date   CHOL 114 10/01/2012   HDL 31* 10/01/2012   LDLCALC 70 10/01/2012   TRIG 66 10/01/2012   CHOLHDL 3.7 10/01/2012   Lab Results  Component Value Date   HGBA1C 12.6* 10/12/2013     No results found for this basename: TSH    Hepatic Function Panel  Recent Labs  10/12/13 1230  PROT 8.6*  ALBUMIN 3.6  AST 16  ALT 17  ALKPHOS 148*  BILITOT 0.6   No results found for this basename: CHOL,  in the last 72 hours No results found for this basename: PROTIME,  in the last 72 hours    EKG: Orders placed during the hospital encounter of 10/12/13  . EKG 12-LEAD  . EKG 12-LEAD  . ED EKG  . ED EKG    Studies/Results: Dg Chest Portable 1 View  10/12/2013   CLINICAL DATA:  71 year old female with weakness, shortness of breath and fatigue. Initial encounter.  EXAM: PORTABLE CHEST - 1 VIEW   COMPARISON:  09/12/2013 and earlier.  FINDINGS: Portable AP upright view at 1340 hrs. Interval resolved small pleural effusions. Stable cardiomegaly and mediastinal contours. Stable left chest pacemaker. Visualized tracheal air column is within normal limits. No pneumothorax or pulmonary edema. No confluent pulmonary opacity.  IMPRESSION: No acute cardiopulmonary abnormality. Interval resolved small pleural effusions.   Electronically Signed   By: Augusto Gamble M.D.   On: 10/12/2013 14:00    Medications: I have reviewed the patient's current medications. Scheduled Meds: . amitriptyline  20 mg Oral QHS  . cefTRIAXone (ROCEPHIN)  IV  1 g Intravenous Q24H  . heparin  5,000 Units Subcutaneous Q8H  . hydrALAZINE  37.5 mg Oral TID  . insulin aspart  0-15 Units Subcutaneous TID WC  . insulin aspart  0-5 Units Subcutaneous QHS  . insulin glargine  20 Units Subcutaneous QHS  . isosorbide dinitrate  30 mg Oral TID  . metoprolol tartrate  25 mg Oral BID  . sodium chloride  3 mL Intravenous Q12H   Continuous Infusions: . sodium chloride 20 mL/hr at 10/13/13 0800   PRN Meds:.acetaminophen, acetaminophen, albuterol, albuterol, dextrose, HYDROcodone-acetaminophen, HYDROmorphone (  DILAUDID) injection, ondansetron (ZOFRAN) IV, ondansetron  Assessment/Plan: Principal Problem:   Diabetic hyperosmolar non-ketotic state Active Problems:   Hypertension   Complete heart block s/p dual chamber Medtronic pacmaker 2006   Cardiomyopathy, (? ischemic AS AK on echo), EF 25%   CKD (chronic kidney disease) stage 3, GFR 30-59 ml/min   Dehydration   Acute renal failure   Hyponatremia  PLAN: Presented with decompensated DM and hypovolemia.    Per Dr. Royann Shivers "She does have severely depressed LV function, cardiomyopathy of uncertain etiology, partly due to obligatory RV pacing secondary to complete AV block. At her office visit we discussed options to improve symptoms and outcomes by upgrading to a BiV device (either  CRT-D or CRT-P). I also recommended an echo for a diastolic murmur that I could not explain. - will get echo while she is here. " Echo P.  We will see back on Monday unless you need [place] before.   LOS: 1 day   Time spent with pt. : 0 minutes. [facility]  Nurse Practitioner Certified Pager [phone] 10/13/2013, 1:00 PM

## 2013-10-13 NOTE — Progress Notes (Signed)
Nutrition Consult Note  Wt Readings from Last 15 Encounters:  10/13/13 143 lb 11.8 oz (65.2 kg)  09/21/13 158 lb 12.8 oz (72.031 kg)  11/22/12 158 lb 15.2 oz (72.1 kg)  10/04/12 146 lb 8 oz (66.452 kg)    Body mass index is 25.47 kg/(m^2). Patient meets criteria for overweight based on current BMI.   Lab Results  Component Value Date   HGBA1C 12.6* 10/12/2013   CBG (last 3)   Recent Labs  10/13/13 0504 10/13/13 1127 10/13/13 1555  GLUCAP 168* 210* 332*     Current diet order is dysphagia 2, CHO modified, 2g sodium, thin liquid, patient is consuming approximately 50% of meals at this time. Labs and medications reviewed. Met with pt who reports her daughter makes her meals at home and tries to follow a diabetic, heart healthy diet, however RN reported to RD that daughter has been giving pt a lot of Mindi Slicker recently. Pt reports good appetite at home, eats 2 meals/day and drinks 1-2 Boost. Pt c/o problems chewing r/t missing teeth, however is already on dysphagia 2 diet and does not want puree foods. Will notify kitchen of pt's specific texture requests. Daughter not present, however left her handouts on diabetic/heart healthy diet to encourage healthier dietary practices at home. Recommend social work consult as pt reports limited finances play a role in her what her daughter can prepare for her at home. Pt reports 16 pound weight loss in the past 3 weeks which she thinks is r/t changes in fluid weight. Will order Glucerna shakes BID.   No nutrition interventions warranted at this time. If nutrition issues arise, please consult RD.   Levon Hedger MS, RD, LDN [phone] Weekend/After Hours Pager

## 2013-10-14 LAB — BASIC METABOLIC PANEL
CO2: 22 mEq/L (ref 19–32)
Calcium: 9.2 mg/dL (ref 8.4–10.5)
Chloride: 97 mEq/L (ref 96–112)
Creatinine, Ser: 1.68 mg/dL — ABNORMAL HIGH (ref 0.50–1.10)
GFR calc Af Amer: 33 mL/min — ABNORMAL LOW (ref >90)
GFR calc non Af Amer: 29 mL/min — ABNORMAL LOW (ref >90)
Glucose, Bld: 299 mg/dL — ABNORMAL HIGH (ref 70–99)
Potassium: 4.2 mEq/L (ref 3.5–5.1)
Sodium: 132 mEq/L — ABNORMAL LOW (ref 135–145)

## 2013-10-14 LAB — CBC
HCT: 38.7 % (ref 36.0–46.0)
MCH: 27.4 pg (ref 26.0–34.0)
MCV: 79.6 fL (ref 78.0–100.0)
Platelets: 307 10*3/uL (ref 150–400)
RBC: 4.86 MIL/uL (ref 3.87–5.11)
RDW: 14.3 % (ref 11.5–15.5)
WBC: 8.3 10*3/uL (ref 4.0–10.5)

## 2013-10-14 LAB — GLUCOSE, CAPILLARY
Glucose-Capillary: 311 mg/dL — ABNORMAL HIGH (ref 70–99)
Glucose-Capillary: 254 mg/dL — ABNORMAL HIGH (ref 70–99)

## 2013-10-14 MED ORDER — INSULIN GLARGINE 100 UNIT/ML ~~LOC~~ SOLN
20.0000 [IU] | Freq: Two times a day (BID) | SUBCUTANEOUS | Status: DC
  Administered 2013-10-14 – 2013-10-15 (×2): 20 [IU] via SUBCUTANEOUS
  Filled 2013-10-14 (×3): qty 0.2

## 2013-10-14 NOTE — Evaluation (Signed)
Physical Therapy Evaluation [name] [name] Details Name: [name] [name] MRN: [id number] DOB: July 09, 1941 Today's Date: 10/14/2013 Time: 3244-0102 PT Time Calculation (min): 24 min  PT Assessment / Plan / Recommendation History of Present Illness  [name] [name] is a 72 year old man female with history of chronic CHF, EF 25-30% by echo in 09/2012, insulin-dependent diabetes mellitus, complete heart block status post pacemaker, hypertension, hyperlipidemia presented to the ER with progressive weakness over the last 2-3 weeks. [name] [name] reported that she was in the ER last month and was having shortness of breath, [name] [name] received IV Lasix, was DC'd from the ER and her Lasix dose was increased. She subsequently saw her cardiologist, Dr. Royann Shivers. Per Dr. Royann Shivers, she was not receiving maximal medical therapy and recommended to gradually increase CHF medications, repeat echo. [name] [name] also apparently has problem with compliance with medications. She reported that she has not been taking her insulin and over the last 1 week, she was not eating well when she stopped taking her insulin. She however continued to take Lasix and other CHF medications.  ER workup showed sodium of 123, creatinine of 1.6 (yesterday was 1.8), glucose of 666. [name] [name] was started on IV insulin drip in the ER.  Clinical Impression  [name] [name] presents with problems listed below.  Will benefit from acute PT to maximize functional mobility prior to discharge home.  Recommend HHPT for continued therapy and aide for ADL assist at discharge.  Daughter also reports [name] [name] having difficulty with med management.    PT Assessment  [name] [name] needs continued PT services    Follow Up Recommendations  Home health PT;Supervision - Intermittent (HH aide for ADL's)    Does the [name] [name] have the potential to tolerate intense rehabilitation      Barriers to Discharge Decreased caregiver support Daughter works and is available prn.    Equipment Recommendations   3in1 (PT)    Recommendations for Other Services     Frequency Min 3X/week    Precautions / Restrictions Precautions Precautions: Fall Precaution Comments: Reports multiple falls due to "losing my balance" (getting off scales at MD office, turning to sit in chair) Restrictions Weight Bearing Restrictions: No   Pertinent Vitals/Pain       Mobility  Bed Mobility Bed Mobility: Not assessed Transfers Transfers: Sit to Stand;Stand to Sit Sit to Stand: 4: Min guard;With upper extremity assist;With armrests;From chair/3-in-1 Stand to Sit: 4: Min assist;With upper extremity assist;With armrests;To chair/3-in-1 Details for Transfer Assistance: [name] [name] demonstrates safe hand placement for standing.  After ambulation, [name] [name] with decreased control of descent into chair - unsafe.  Reviewed with [name] [name] safe technique.  [name] [name] reports "too fatigued and weak" as reason for unsafe transfer to chair. Ambulation/Gait Ambulation/Gait Assistance: 5: Supervision Ambulation Distance (Feet): 120 Feet Assistive device: Rolling walker Ambulation/Gait Assistance Details: Verbal cues to watch for obstacles in hallway - hit 2 objects with Rt front wheel of RW.  Noted instability of bil knees during stance, with occasional buckling. Gait Pattern: Step-through pattern;Decreased stride length    Exercises     PT Diagnosis: Difficulty walking;Generalized weakness  PT Problem List: Decreased strength;Decreased activity tolerance;Decreased balance;Decreased mobility;Decreased knowledge of use of DME;Cardiopulmonary status limiting activity PT Treatment Interventions: DME instruction;Gait training;Functional mobility training;Therapeutic exercise;[name] [name]/family education     PT Goals(Current goals can be found in the care plan section) Acute Rehab PT Goals [name] [name] Stated Goal: To get stronger PT Goal Formulation: With [name] [name]/family Time For Goal Achievement: 10/21/13 Potential to Achieve Goals:  Good  Visit Information  Last PT Received On: 10/14/13  Assistance Needed: +1 History of Present Illness: [name] [name] is a 72 year old man female with history of chronic CHF, EF 25-30% by echo in 09/2012, insulin-dependent diabetes mellitus, complete heart block status post pacemaker, hypertension, hyperlipidemia presented to the ER with progressive weakness over the last 2-3 weeks. [name] [name] reported that she was in the ER last month and was having shortness of breath, [name] [name] received IV Lasix, was DC'd from the ER and her Lasix dose was increased. She subsequently saw her cardiologist, Dr. Royann Shivers. Per Dr. Royann Shivers, she was not receiving maximal medical therapy and recommended to gradually increase CHF medications, repeat echo. [name] [name] also apparently has problem with compliance with medications. She reported that she has not been taking her insulin and over the last 1 week, she was not eating well when she stopped taking her insulin. She however continued to take Lasix and other CHF medications.  ER workup showed sodium of 123, creatinine of 1.6 (yesterday was 1.8), glucose of 666. [name] [name] was started on IV insulin drip in the ER.       Prior Functioning  Home Living Family/[name] [name] expects to be discharged to:: Private residence Living Arrangements: Alone Available Help at Discharge: Family;Available PRN/intermittently (Daughter lives in nearby apartment) Type of Home: Apartment Home Access: Stairs to enter Secretary/administrator of Steps: 1 Entrance Stairs-Rails: None Home Layout: One level Home Equipment: Environmental consultant - 4 wheels;Cane - single point;Shower seat Prior Function Level of Independence: Independent with assistive device(s);Needs assistance Gait / Transfers Assistance Needed: Mod I with rollator.  Has falls when not using rollator ADL's / Homemaking Assistance Needed: Assist needed for meals, bathing, housekeeping Communication Communication: No difficulties    Cognition   Cognition Arousal/Alertness: Awake/alert Behavior During Therapy: WFL for tasks assessed/performed Overall Cognitive Status: Within Functional Limits for tasks assessed    Extremity/Trunk Assessment Upper Extremity Assessment Upper Extremity Assessment: Overall WFL for tasks assessed Lower Extremity Assessment Lower Extremity Assessment: Generalized weakness Cervical / Trunk Assessment Cervical / Trunk Assessment: Normal   Balance    End of Session PT - End of Session Equipment Utilized During Treatment: Gait belt Activity Tolerance: [name] [name] limited by fatigue [name] [name] left: in chair;with call bell/phone within reach;with family/visitor present Nurse Communication: Mobility status  GP     Vena Austria 10/14/2013, 11:21 AM Durenda Hurt. Renaldo Fiddler, [facility] Acute Rehab Services Pager [phone]

## 2013-10-14 NOTE — Progress Notes (Signed)
[facility] PROGRESS NOTE  Trinna Kunst JXB:[id number] DOB: 09/01/1941 DOA: 10/12/2013 PCP: Doreatha Martin, MD  Assessment/Plan: 1-Diabetic hyperosmolar non-ketotic state with pseudohyponatremia: improved and now CBG's in the 160-170. -patient off insulin drip -will continue SSI and lantus (last one increased to 20 units BID for better control) -further adjustment to her regimen base on PO intake and CBG's fluctuation -CBG's 12.6 -patient non-compliant with insulin. -will arrange [facility] at discharge for assistance with medication compliance; daughter also looking to involve herself more in assisting patient with medications.  2-Dehydration: patient received 24 hrs of IVF's. Now eating and drinking properly. -will continue IVF's to [facility]  -resume lasix and spironolactone -Follow BMET in am  3-UTI: culture with insignificant growth -continue rocephin to complete 4 days tx -no complaints of dysuria  4-HTN: stable. Will continue hydralazine, lopressor and isosorbide.  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 ischemic AS AK on echo), EF 25%: -2-D echo (EF 25%; grade diastolic dysfunction and dyssynchrony on contraction) -future plans for ICD/pacemaker upgrades -will follow rec's from cardiology  6-acute on chronic renal failure: patient with stage III at baseline. (Cr 1.3-1.7) -due to dehydration, continue use of nephrotoxic agents and UTI. -improved and back to baseline -will monitor closely -resume lasix and spironolactone today (11/23) -BMET in am -continue tx for UTI    [id number]   Code Status: Full Family Communication: no family at bedisde Disposition Plan: to be determine   Consultants:  Cardiology   Procedures:  2-D echo: pending  Antibiotics:  Rocephin   HPI/Subjective: Feeling better, no nausea, vomiting, abd pain or fever. Denies dysuria. CBG's fluctuating, but highest around  300  Objective: Filed Vitals:   10/14/13 1403  BP: 119/52  Pulse: 87  Temp: 98.2 F (36.8 C)  Resp: 20    Intake/Output Summary (Last 24 hours) at 10/14/13 1656 Last data filed at 10/14/13 1359  Gross per 24 hour  Intake   1300 ml  Output    600 ml  Net    700 ml   Filed Weights   10/12/13 1723 10/13/13 0410 10/14/13 0607  Weight: 65.1 kg (143 lb 8.3 oz) 65.2 kg (143 lb 11.8 oz) 66.3 kg (146 lb 2.6 oz)    Exam:   General:  AAOX3; afebrile, feeling better and tolerating diet  Cardiovascular: S1 and S2, positive murmur, no rubs or gallops; no JVD and no LE edema  Respiratory: CTA bilaterally  Abdomen: soft, NT, ND, positive BS  Musculoskeletal: no Le edema, no erythema  Data Reviewed: Basic Metabolic Panel:  Recent Labs Lab 10/11/13 1108 10/12/13 1230 10/12/13 1543 10/12/13 1940 10/13/13 0640 10/14/13 0655  NA 122* 123* 128*  --  132* 132*  K 5.5* 4.5 4.6  --  3.9 4.2  CL 88* 86* 91*  --  97 97  CO2 29 23 26   --  23 22  GLUCOSE 731* 666* 542*  --  179* 299*  BUN 63* 69* 65*  --  56* 48*  CREATININE 1.80* 1.66* 1.55* 1.55* 1.67* 1.68*  CALCIUM 9.5 10.1 9.3  --  9.1 9.2   Liver Function Tests:  Recent Labs Lab 10/11/13 1108 10/12/13 1230  AST 16 16  ALT 16 17  ALKPHOS 130* 148*  BILITOT 0.7 0.6  PROT 7.3 8.6*  ALBUMIN 3.5 3.6   CBC:  Recent Labs Lab 10/12/13 1230 10/12/13 1940 10/13/13 0640 10/14/13 0655  WBC 9.3 7.1 8.5 8.3  HGB 15.2* 13.2 14.1 13.3  HCT 42.4 39.5 40.4  38.7  MCV 77.5* 77.0* 78.3 79.6  PLT 320 298 314 307   BNP (last 3 results)  Recent Labs  11/19/12 1200 11/21/12 0602 09/12/13 1035  PROBNP 7378.0* 4614.0* 4807.0*   CBG:  Recent Labs Lab 10/13/13 1555 10/13/13 1945 10/13/13 2103 10/14/13 0557 10/14/13 1059  GLUCAP 332* 238* 358* 279* 311*    Recent Results (from the past 240 hour(s))  URINE CULTURE     Status: None   Collection Time    10/12/13  1:05 PM      Result Value Range Status   Specimen  Description URINE, CLEAN CATCH   Final   Special Requests NONE   Final   Culture  Setup Time     Final   Value: 10/12/2013 17:01     Performed at [facility] Count     Final   Value: 6,000 COLONIES/ML     Performed at [facility]   Culture     Final   Value: INSIGNIFICANT GROWTH     Performed at [facility]   Report Status 10/13/2013 FINAL   Final     Studies: No results found.  Scheduled Meds: . amitriptyline  20 mg Oral QHS  . cefTRIAXone (ROCEPHIN)  IV  1 g Intravenous Q24H  . feeding supplement (GLUCERNA SHAKE)  237 mL Oral BID BM  . furosemide  20 mg Oral Q breakfast  . heparin  5,000 Units Subcutaneous Q8H  . hydrALAZINE  37.5 mg Oral TID  . insulin aspart  0-15 Units Subcutaneous TID WC  . insulin aspart  0-5 Units Subcutaneous QHS  . insulin glargine  20 Units Subcutaneous BID  . isosorbide dinitrate  30 mg Oral TID  . metoprolol tartrate  25 mg Oral BID  . sodium chloride  3 mL Intravenous Q12H  . spironolactone  12.5 mg Oral Daily   Continuous Infusions: . sodium chloride 20 mL/hr at 10/13/13 0800     Time spent: >30 minutes   Emi Lymon  [facility] Pager [phone]. If 7PM-7AM, please contact night-coverage at [email or web address], password [facility] 10/14/2013, 4:56 PM  LOS: 2 days

## 2013-10-14 NOTE — Progress Notes (Signed)
Patient had c/o generalized pain. PRN Vicodin administered as ordered.  Patient currently resting comfortably with no visible signs of discomfort. Will continue to monitor. [name] [name]

## 2013-10-15 DIAGNOSIS — E871 Hypo-osmolality and hyponatremia: Secondary | ICD-10-CM

## 2013-10-15 LAB — URINE CULTURE
Colony Count: NO GROWTH
Culture: NO GROWTH

## 2013-10-15 LAB — BASIC METABOLIC PANEL
BUN: 43 mg/dL — ABNORMAL HIGH (ref 6–23)
Calcium: 9.1 mg/dL (ref 8.4–10.5)
Chloride: 96 mEq/L (ref 96–112)
GFR calc Af Amer: 32 mL/min — ABNORMAL LOW (ref >90)
GFR calc non Af Amer: 28 mL/min — ABNORMAL LOW (ref >90)
Glucose, Bld: 253 mg/dL — ABNORMAL HIGH (ref 70–99)
Potassium: 4.1 mEq/L (ref 3.5–5.1)
Sodium: 131 mEq/L — ABNORMAL LOW (ref 135–145)

## 2013-10-15 LAB — GLUCOSE, CAPILLARY: Glucose-Capillary: 248 mg/dL — ABNORMAL HIGH (ref 70–99)

## 2013-10-15 MED ORDER — SITAGLIPTIN PHOSPHATE 50 MG PO TABS: 25.0000 mg | ORAL_TABLET | Freq: Every day | ORAL | Status: AC

## 2013-10-15 MED ORDER — SPIRONOLACTONE 25 MG PO TABS: 12.5000 mg | ORAL_TABLET | Freq: Every day | ORAL | Status: AC

## 2013-10-15 MED ORDER — INSULIN GLARGINE 100 UNIT/ML ~~LOC~~ SOLN: 35.0000 [IU] | Freq: Two times a day (BID) | SUBCUTANEOUS | Status: DC

## 2013-10-15 NOTE — Progress Notes (Signed)
Patient had c/o generalized pain.  PRN Vicodin administered as ordered.  Patient currently resting comfortably and denies any pain. Will continue to monitor. [name] [name]

## 2013-10-15 NOTE — Progress Notes (Signed)
UR completed. Crosby Oriordan RN CCM Case Mgmt 

## 2013-10-15 NOTE — Evaluation (Signed)
Occupational Therapy Evaluation Patient Details Name: [name] [name] MRN: [id number] DOB: 09-May-1941 Today's Date: 10/15/2013 Time: 4098-1191 OT Time Calculation (min): 20 min  OT Assessment / Plan / Recommendation History of present illness Patient is a 72 year old man female with history of chronic CHF, EF 25-30% by echo in 09/2012, insulin-dependent diabetes mellitus, complete heart block status post pacemaker, hypertension, hyperlipidemia presented to the ER with progressive weakness over the last 2-3 weeks. Patient reported that she was in the ER last month and was having shortness of breath, patient received IV Lasix, was DC'd from the ER and her Lasix dose was increased. She subsequently saw her cardiologist, Dr. Royann Shivers. Per Dr. Royann Shivers, she was not receiving maximal medical therapy and recommended to gradually increase CHF medications, repeat echo. Patient also apparently has problem with compliance with medications. She reported that she has not been taking her insulin and over the last 1 week, she was not eating well when she stopped taking her insulin. She however continued to take Lasix and other CHF medications.  ER workup showed sodium of 123, creatinine of 1.6 (yesterday was 1.8), glucose of 666. Patient was started on IV insulin drip in the ER.   Clinical Impression   Pt at min guard - min A level with LB ADLs due to balance impairments. No further acute OT services indicated and pt should continue with acute PT services to address balance, safety and functional mobility. All education completed and OT will sign off    OT Assessment  All further OT needs can be met in the next venue of care    Follow Up Recommendations  Home health OT;Supervision - Intermittent;Other (comment) (HH aide for ADLs?)    Barriers to Discharge   none  Equipment Recommendations  None recommended by OT    Recommendations for Other Services    Frequency       Precautions / Restrictions  Precautions Precautions: Fall Precaution Comments: Reports multiple falls due to "losing my balance" (getting off scales at MD office, turning to sit in chair) Restrictions Weight Bearing Restrictions: No   Pertinent Vitals/Pain No c/o pain    ADL  Grooming: Performed;Wash/dry hands;Wash/dry face;Min guard;Minimal assistance Where Assessed - Grooming: Supported standing Upper Body Bathing: Supervision/safety;Simulated;Set up Where Assessed - Upper Body Bathing: Unsupported sitting Lower Body Bathing: Minimal assistance;Min guard Where Assessed - Lower Body Bathing: Unsupported sitting;Supported sit to stand Upper Body Dressing: Performed;Supervision/safety;Set up Where Assessed - Upper Body Dressing: Unsupported sitting Lower Body Dressing: Performed;Minimal assistance;Min guard Where Assessed - Lower Body Dressing: Unsupported sitting;Supported sit to stand Toilet Transfer: Performed;Min guard Toilet Transfer Method: Sit to Barista: Regular height toilet;Grab bars Toileting - Architect and Hygiene: Min guard Where Assessed - Engineer, mining and Hygiene: Standing Tub/Shower Transfer Method: Not assessed Equipment Used: Gait belt Transfers/Ambulation Related to ADLs: cues for correct hand placement and control of descent    OT Diagnosis: Generalized weakness  OT Problem List:   OT Treatment Interventions:     OT Goals(Current goals can be found in the care plan section) Acute Rehab OT Goals Patient Stated Goal: To get stronger  Visit Information  Last OT Received On: 10/08/13 Assistance Needed: +1 History of Present Illness: Patient is a 72 year old man female with history of chronic CHF, EF 25-30% by echo in 09/2012, insulin-dependent diabetes mellitus, complete heart block status post pacemaker, hypertension, hyperlipidemia presented to the ER with progressive weakness over the last 2-3 weeks. Patient reported that she was in  the ER last month and was having shortness of breath, patient received IV Lasix, was DC'd from the ER and her Lasix dose was increased. She subsequently saw her cardiologist, Dr. Royann Shivers. Per Dr. Royann Shivers, she was not receiving maximal medical therapy and recommended to gradually increase CHF medications, repeat echo. Patient also apparently has problem with compliance with medications. She reported that she has not been taking her insulin and over the last 1 week, she was not eating well when she stopped taking her insulin. She however continued to take Lasix and other CHF medications.  ER workup showed sodium of 123, creatinine of 1.6 (yesterday was 1.8), glucose of 666. Patient was started on IV insulin drip in the ER.       Prior Functioning     Home Living Family/patient expects to be discharged to:: Private residence Living Arrangements: Alone Available Help at Discharge: Family;Available PRN/intermittently Type of Home: Apartment Home Access: Stairs to enter Entrance Stairs-Number of Steps: 1 Entrance Stairs-Rails: None Home Layout: One level Home Equipment: Walker - 4 wheels;Cane - single point;Shower seat Prior Function Level of Independence: Independent with assistive device(s);Needs assistance Gait / Transfers Assistance Needed: Mod I with rollator.  Has falls when not using rollator ADL's / Homemaking Assistance Needed: Assist needed for meals, bathing, housekeeping Communication Communication: No difficulties Dominant Hand: Right         Vision/Perception Vision - History Baseline Vision: Wears glasses all the time Patient Visual Report: No change from baseline Perception Perception: Within Functional Limits   Cognition  Cognition Arousal/Alertness: Awake/alert Behavior During Therapy: WFL for tasks assessed/performed Overall Cognitive Status: Within Functional Limits for tasks assessed    Extremity/Trunk Assessment Upper Extremity Assessment Upper Extremity  Assessment: Overall WFL for tasks assessed;Generalized weakness Lower Extremity Assessment Lower Extremity Assessment: Defer to PT evaluation Cervical / Trunk Assessment Cervical / Trunk Assessment: Normal     Mobility Bed Mobility Bed Mobility: Not assessed Details for Bed Mobility Assistance: pt up in recliner Transfers Transfers: Sit to Stand;Stand to Sit Sit to Stand: 4: Min guard;With upper extremity assist;With armrests;From chair/3-in-1;From toilet Stand to Sit: 4: Min assist;With upper extremity assist;With armrests;To chair/3-in-1;To toilet Details for Transfer Assistance: cues for correct hand placement and control of descent     Exercise     Balance Balance Balance Assessed: Yes Dynamic Sitting Balance Dynamic Sitting - Balance Support: No upper extremity supported;Feet supported;During functional activity Dynamic Sitting - Level of Assistance: 7: Independent Static Standing Balance Static Standing - Balance Support: Left upper extremity supported;During functional activity Static Standing - Level of Assistance: 4: Min assist;Other (comment) (min guard A)   End of Session OT - End of Session Equipment Utilized During Treatment: Gait belt Activity Tolerance: Patient tolerated treatment well Patient left: in chair;with call bell/phone within reach  GO     Galen Manila 10/15/2013, 3:00 PM

## 2013-10-15 NOTE — Discharge Summary (Signed)
Physician Discharge Summary  [name] [name] ZOX:[id number] DOB: [birth year] DOA: 10/12/2013  PCP: Doreatha Martin, MD  Admit date: 10/12/2013 Discharge date: 10/15/2013  Time spent: >30 minutes  Recommendations for Outpatient Follow-up:  1-BMET to follow electrolytes and renal function 2-Close follow up and further adjustments to hypoglycemic regimen 3-Cardiology to decide regarding pacemaker upgrades and ICD placement  Discharge Diagnoses:  Principal Problem:   Diabetic hyperosmolar non-ketotic state Active Problems:   Hypertension   Complete heart block s/p dual chamber Medtronic pacmaker 2006   Cardiomyopathy, (? ischemic AS AK on echo), EF 25%   CKD (chronic kidney disease) stage 3, GFR 30-59 ml/min   Dehydration   Acute renal failure   Hyponatremia   Discharge Condition: stable and improved. Will discharge home with home health services.  Diet recommendation: low sodium and low carb diet  Filed Weights   10/13/13 0410 10/14/13 0607 10/15/13 0601  Weight: 65.2 kg (143 lb 11.8 oz) 66.3 kg (146 lb 2.6 oz) 66.452 kg (146 lb 8 oz)    History of present illness:  72 year old man female with history of chronic CHF, EF 25-30% by echo in 09/2012, insulin-dependent diabetes mellitus, complete heart block status post pacemaker, hypertension, hyperlipidemia presented to the ER with progressive weakness over the last 2-3 weeks. Patient reported that she was in the ER last month and was having shortness of breath, patient received IV Lasix, was DC'd from the ER and her Lasix dose was increased. She subsequently saw her cardiologist, Dr. Royann Shivers. Per Dr. Royann Shivers, she was not receiving maximal medical therapy and recommended to gradually increase CHF medications, repeat echo. Patient also apparently has problem with compliance with medications. She reported that she has not been taking her insulin and over the last 1 week, she was not eating well when she stopped taking her insulin. She  however continued to take Lasix and other CHF medications.  ER workup showed sodium of 123, creatinine of 1.6; glucose of 666. Patient was started on IV insulin drip in the ER and [facility] called to admit for further evaluation and treatment  Hospital Course:  1-Diabetic hyperosmolar non-ketotic state with pseudohyponatremia: improved and with highest CBG in the 250 range. -electrolytes WNL -will discharge on lantus 35 units BID, low carb diet and 25mg  of sitapglitin daily. -further adjustment to her regimen base on CBG's fluctuation during follow up with PCP -CBG's 12.6  -patient was non-compliant with insulin prior to admission.  -will arrange HHRN at discharge for assistance with medication compliance; daughter also looking to involve herself more in assisting patient with medications.   2-Dehydration: patient received 24 hrs of IVF's. Now eating and drinking properly.  -advise to keep herself hydrated -resume lasix and spironolactone (1/2 dose) -Follow BMET in am   3-UTI: culture with insignificant growth  -creceived rocephin to complete 4 days tx total as inpatient -no complaints of dysuria  -patient afebrile  4-HTN: stable. Will continue hydralazine, lopressor, lasix, spironolactone (1/2 dose) and isosorbide.   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 ischemic AS AK on echo), EF 25%:  -2-D echo (EF 25%; grade 2 diastolic dysfunction and dyssynchrony on contraction. Also with mild mitral regurgitation)  -future plans for ICD/pacemaker upgrades per cardiology  6-acute on chronic renal failure: patient with stage III at baseline. (Cr 1.3-1.7)  -due to dehydration, continue use of nephrotoxic agents and UTI.  -improved and back to baseline  -will require close monitoring in outpatient setting  -resume lasix and spironolactone (1/2 dose)  -  BMET in 1 week   Procedures:  2-D echo (no wall motion abnormalities, EF 20-25%; grade 2 diastolic  dysfunction and mild mitral regurgitation)  Consultations:  Cardiology   Discharge Exam: Filed Vitals:   10/15/13 0601  BP: 127/70  Pulse: 100  Temp: 98 F (36.7 C)  Resp: 16   General: AAOX3; afebrile, feeling better and tolerating diet/medications w/o problems Cardiovascular: S1 and S2, positive murmur, no rubs or gallops; no JVD and no LE edema  Respiratory: CTA bilaterally  Abdomen: soft, NT, ND, positive BS  Musculoskeletal: no Le edema, no erythema  Discharge Instructions  Discharge Orders   Future Appointments Provider Department Dept Phone   10/16/2013 11:00 AM [facility] [place] [facility] [address] [phone]   10/25/2013 10:30 AM Thurmon Fair, MD [facility] [phone]   Future Orders Complete By Expires   Discharge instructions  As directed    Comments:     Arrange follow up with PCP in 1 week Call cardiology office for follow up appointment details Take medications as prescribed Follow low sodium diet (< 2000 mg daily) Follow a low carbohydrates diet Be compliant with your insulin and other medications       Medication List         albuterol 108 (90 BASE) MCG/ACT inhaler  Commonly known as:  PROVENTIL HFA;VENTOLIN HFA  Inhale 2 puffs into the lungs every 6 (six) hours as needed. For wheezing     amitriptyline 10 MG tablet  Commonly known as:  ELAVIL  Take 20 mg by mouth at bedtime.     furosemide 40 MG tablet  Commonly known as:  LASIX  Take 1 tablet (40 mg total) by mouth daily.     hydrALAZINE 25 MG tablet  Commonly known as:  APRESOLINE  Take 1.5 tablets (37.5 mg total) by mouth 3 (three) times daily.     insulin glargine 100 UNIT/ML injection  Commonly known as:  LANTUS  Inject 0.35 mLs (35 Units total) into the skin 2 (two) times daily. Take 30 units in the morning or 35 units in the evening     isosorbide dinitrate 10 MG tablet  Commonly known as:  ISORDIL  Take 3 tablets (30 mg total) by  mouth 3 (three) times daily.     metoprolol tartrate 25 MG tablet  Commonly known as:  LOPRESSOR  Take 25 mg by mouth 2 (two) times daily.     naproxen sodium 220 MG tablet  Commonly known as:  ANAPROX  Take 440 mg by mouth daily as needed. For pain     sitaGLIPtin 50 MG tablet  Commonly known as:  JANUVIA  Take 0.5 tablets (25 mg total) by mouth daily.     spironolactone 25 MG tablet  Commonly known as:  ALDACTONE  Take 0.5 tablets (12.5 mg total) by mouth daily.       Allergies  Allergen Reactions  . Codeine Nausea Only  . Statins Diarrhea       Follow-up Information   Follow up with [facility]. (RN/NA/PT/OT)    Contact information:   [address] [place] [address] [phone]       Follow up with [facility]. (RN/NA/PT/OT)    Contact information:   [address] [place] [address] [phone]       Follow up with [facility], MD. Schedule an appointment as soon as possible for a visit in 1 week.   Specialty:  Internal Medicine   Contact information:   [address] [facility] [place] [address]       Follow up with Thurmon Fair, MD. (call office for appointment details)    Specialty:  Cardiology   Contact information:   [address] [place] [address] [phone]        The results of significant diagnostics from this hospitalization (including imaging, microbiology, ancillary and laboratory) are listed below for reference.    Significant Diagnostic Studies: Dg Chest Portable 1 View  10/12/2013   CLINICAL DATA:  72 year old female with weakness, shortness of breath and fatigue. Initial encounter.  EXAM: PORTABLE CHEST - 1 VIEW  COMPARISON:  09/12/2013 and earlier.  FINDINGS: Portable AP upright view at 1340 hrs. Interval resolved small pleural effusions. Stable cardiomegaly and mediastinal contours. Stable left chest pacemaker. Visualized tracheal air  column is within normal limits. No pneumothorax or pulmonary edema. No confluent pulmonary opacity.  IMPRESSION: No acute cardiopulmonary abnormality. Interval resolved small pleural effusions.   Electronically Signed   By: Augusto Gamble M.D.   On: 10/12/2013 14:00    Microbiology: Recent Results (from the past 240 hour(s))  URINE CULTURE     Status: None   Collection Time    10/12/13  1:05 PM      Result Value Range Status   Specimen Description URINE, CLEAN CATCH   Final   Special Requests NONE   Final   Culture  Setup Time     Final   Value: 10/12/2013 17:01     Performed at [facility] Count     Final   Value: 6,000 COLONIES/ML     Performed at [facility]   Culture     Final   Value: INSIGNIFICANT GROWTH     Performed at [facility]   Report Status 10/13/2013 FINAL   Final  URINE CULTURE     Status: None   Collection Time    10/13/13  3:53 PM      Result Value Range Status   Specimen Description URINE, RANDOM   Final   Special Requests NONE   Final   Culture  Setup Time     Final   Value: 10/14/2013 02:05     Performed at [facility] Count     Final   Value: NO GROWTH     Performed at [facility]   Culture     Final   Value: NO GROWTH     Performed at [facility]   Report Status 10/15/2013 FINAL   Final     Labs: Basic Metabolic Panel:  Recent Labs Lab 10/12/13 1230 10/12/13 1543 10/12/13 1940 10/13/13 0640 10/14/13 0655 10/15/13 0555  NA 123* 128*  --  132* 132* 131*  K 4.5 4.6  --  3.9 4.2 4.1  CL 86* 91*  --  97 97 96  CO2 23 26  --  23 22 23   GLUCOSE 666* 542*  --  179* 299* 253*  BUN 69* 65*  --  56* 48* 43*  CREATININE 1.66* 1.55* 1.55* 1.67* 1.68* 1.74*  CALCIUM 10.1 9.3  --  9.1 9.2 9.1   Liver Function Tests:  Recent Labs Lab 10/11/13 1108 10/12/13 1230  AST 16 16  ALT 16 17  ALKPHOS 130* 148*  BILITOT 0.7 0.6  PROT 7.3 8.6*  ALBUMIN 3.5 3.6   CBC:  Recent  Labs Lab 10/12/13  1230 10/12/13 1940 10/13/13 0640 10/14/13 0655  WBC 9.3 7.1 8.5 8.3  HGB 15.2* 13.2 14.1 13.3  HCT 42.4 39.5 40.4 38.7  MCV 77.5* 77.0* 78.3 79.6  PLT 320 298 314 307   BNP: BNP (last 3 results)  Recent Labs  11/19/12 1200 11/21/12 0602 09/12/13 1035  PROBNP 7378.0* 4614.0* 4807.0*   CBG:  Recent Labs Lab 10/14/13 1059 10/14/13 1634 10/14/13 2107 10/15/13 0559 10/15/13 1129  GLUCAP 311* 296* 254* 248* 297*    Signed:  Meagen Limones  [facility] 10/15/2013, 1:17 PM

## 2013-10-15 NOTE — Progress Notes (Signed)
1600 discharge instructions and prescriptions given to pt and Daughter. Verbalized understanding .Wheeled to lobby by NT

## 2013-10-15 NOTE — Progress Notes (Signed)
Spoke briefly with patient.  Patient was complaining about the food they had been sending her that she could not eat.  States that her blood sugars are always high at home.  Takes insulin, but does not know how much or what kind.  States that her daughter knows everything. Daughter is at work at present.  Does have a PCP. Note that HgbA1C is 12.6% and needs to be followed up by PCP.   Will continue to follow while in hospital.   Smith Mince RN BSN CDE

## 2013-10-15 NOTE — Care Management Note (Addendum)
  Page 2 of 2   10/15/2013     11:42:43 AM   CARE MANAGEMENT NOTE 10/15/2013  Patient:  [name] [name],[name] [name]   Account Number:  [id number]  Date Initiated:  10/15/2013  Documentation initiated by:  Oletta Cohn  Subjective/Objective Assessment:   72 year old female with history of chronic CHF, EF 25-30% by echo in 09/2012, insulin-dependent DM, complete heart block s/p pacemaker, HTN, hyperlipidemia present to the ER with progressive weakness./ Home alone (daughter near)     Action/Plan:   IV glucose stabilizer, gentle hydration until the blood sugars are improved (the patient has a history of systolic CHF with EF of 25%) hence avoid aggressive IV fluids  - Hold Lasix, spironolactone, obtain serial BMET / home with [facility]   Anticipated DC Date:  10/15/2013   Anticipated DC Plan:  HOME W HOME HEALTH SERVICES      DC Planning Services  CM consult      [facility] Choice  HOME HEALTH   Choice offered to / List presented to:  C-4 Adult Children        HH arranged  HH-1 RN  HH-10 DISEASE MANAGEMENT  HH-2 PT  HH-3 OT  HH-4 NURSE'S AIDE      HH agency  [facility]   Status of service:   Medicare Important Message given?   (If response is "NO", the following Medicare IM given date fields will be blank) Date Medicare IM given:   Date Additional Medicare IM given:    Discharge Disposition:    Per UR Regulation:    If discussed at Long Length of Stay Meetings, dates discussed:    Comments:  10/15/13 1100 Hugh Garrow, RN, BSN, [employer] [phone] Spoke with pt  and daughter Suzette Battiest) at bedside regarding discharge planning for [facility]. Offered pt list of home health agencies to choose from.  Pt chose [facility] to render services of RN/NA/PT/OT. [facility] of [facility] notified.  No DME needs identified at this time.

## 2013-10-15 NOTE — Progress Notes (Signed)
CBGs on 11/23: [phone] mg/dl       16/10: 960-454 mg/dl Consider increasing Lantus to 25 units twice a day if CBGs continue greater than 180 mg/dl. Titrate as needed.  Continue Novolog MODERATE correction scale TID & HS.   Smith Mince RN BSN CDE

## 2013-10-16 ENCOUNTER — Ambulatory Visit (HOSPITAL_COMMUNITY): Payer: Medicare Other

## 2013-10-23 ENCOUNTER — Other Ambulatory Visit: Payer: Self-pay | Admitting: *Deleted

## 2013-10-23 MED ORDER — ALBUTEROL SULFATE HFA 108 (90 BASE) MCG/ACT IN AERS: 2.0000 | INHALATION_SPRAY | Freq: Four times a day (QID) | RESPIRATORY_TRACT | Status: AC | PRN

## 2013-10-25 ENCOUNTER — Ambulatory Visit (INDEPENDENT_AMBULATORY_CARE_PROVIDER_SITE_OTHER): Payer: Medicare Other | Admitting: Cardiovascular Disease

## 2013-10-25 ENCOUNTER — Encounter: Payer: Self-pay | Admitting: Cardiovascular Disease

## 2013-10-25 VITALS — BP 119/64 | HR 72 | Resp 20 | Ht 63.0 in | Wt 147.3 lb

## 2013-10-25 DIAGNOSIS — I442 Atrioventricular block, complete: Secondary | ICD-10-CM

## 2013-10-25 DIAGNOSIS — I509 Heart failure, unspecified: Secondary | ICD-10-CM

## 2013-10-25 DIAGNOSIS — I5023 Acute on chronic systolic (congestive) heart failure: Secondary | ICD-10-CM

## 2013-10-25 LAB — PACEMAKER DEVICE OBSERVATION

## 2013-10-25 MED ORDER — ISOSORBIDE DINITRATE 10 MG PO TABS: 20.0000 mg | ORAL_TABLET | Freq: Three times a day (TID) | ORAL | Status: DC

## 2013-10-25 MED ORDER — HYDRALAZINE HCL 25 MG PO TABS: 50.0000 mg | ORAL_TABLET | Freq: Three times a day (TID) | ORAL | Status: AC

## 2013-10-25 NOTE — Patient Instructions (Addendum)
   Increase Hydralazine to 50mg  three times a day.  Increase Isosorbide 10mg  to 2 tablets three times a day.  Your physician recommends that you schedule a follow-up appointment in: One Month along with a pacemaker check.

## 2013-10-28 NOTE — Assessment & Plan Note (Addendum)
We plan to reevaluate her EF by echocardiogram after 3 months of improved medical therapy for congestive heart failure. If there is not a substantial improvement in left ventricular ejection fraction, she would benefit from cardiac resynchronization therapy. We have already discussed the indications for CRT and defibrillatory therapies. She has thought about her options and at least at this point she wants "the whole deal", meaning she wants a defibrillator as well as biventricular pacing. I have tried to point out to her the distinction between quality of life interventions and length of life interventions, gently pointing out that she is in her mid 78s. Her daughter has been present at all of these meetings, including today and seems to agree with her mother's decision. Her pacemaker is now in the last few months of service. The optimal time to upgrade her device to be when she needs a generator replacement, probably in the next 5 months.

## 2013-10-28 NOTE — Progress Notes (Signed)
Patient ID: [name] [name], female   DOB: 1940-12-06, 72 y.o.   MRN: [id number]      Reason for office visit CHF follow up after recent hospitalization  The patient is a 72 year old Caucasian female with a history of tobacco abuse, apparently quit 10-20 years ago, diabetes mellitus uncontrolled, hypertension, nonischemic cardiomyopathy, cardiac arrest in 2006, Medtronic permanent pacemaker in 2006, hyperlipidemia, chronic renal insufficiency stage III.   Was recently admitted to the hospital for hyperosmolar diabetic nonketotic state. She had severe hyponatremia, hyperglycemia but no acidosis. She was quite dehydrated and had mild acute renal failure. All of these problems improved after treatment with fluids and better glycemic control. She has stopped using insulin completely prior to this admission. He had continued taking loop diuretics. An echocardiogram performed during this hospitalization showed an ejection fraction of about 25% and grade 2 diastolic dysfunction with prominent systolic dyssynchrony do to right ventricular pacing.  She now feels substantially better. She is back on her heart failure medications, taking half of the previous dose of loop diuretic and aldosterone antagonist.  Allergies  Allergen Reactions  . Codeine Nausea Only  . Statins Diarrhea    Current Outpatient Prescriptions  Medication Sig Dispense Refill  . albuterol (PROVENTIL HFA;VENTOLIN HFA) 108 (90 BASE) MCG/ACT inhaler Inhale 2 puffs into the lungs every 6 (six) hours as needed. For wheezing  1 Inhaler  6  . amitriptyline (ELAVIL) 10 MG tablet Take 10 mg by mouth at bedtime.       . furosemide (LASIX) 40 MG tablet Take 1 tablet (40 mg total) by mouth daily.  30 tablet  0  . hydrALAZINE (APRESOLINE) 25 MG tablet Take 2 tablets (50 mg total) by mouth 3 (three) times daily.  180 tablet  6  . insulin glargine (LANTUS) 100 UNIT/ML injection Inject 0.35 mLs (35 Units total) into the skin 2 (two) times daily.  Take 30 units in the morning or 35 units in the evening  10 mL  12  . isosorbide dinitrate (ISORDIL) 10 MG tablet Take 2 tablets (20 mg total) by mouth 3 (three) times daily.  180 tablet  6  . metoprolol tartrate (LOPRESSOR) 25 MG tablet Take 25 mg by mouth 2 (two) times daily.      . naproxen sodium (ANAPROX) 220 MG tablet Take 440 mg by mouth daily as needed. For pain      . sitaGLIPtin (JANUVIA) 50 MG tablet Take 0.5 tablets (25 mg total) by mouth daily.      Marland Kitchen spironolactone (ALDACTONE) 25 MG tablet Take 0.5 tablets (12.5 mg total) by mouth daily.       No current facility-administered medications for this visit.    Past Medical History  Diagnosis Date  . Hypertension   . CHF (congestive heart failure)   . Hyperlipidemia   . Nonischemic cardiomyopathy     Hattie Perch 11/20/2012  . ICD (implantable cardiac defibrillator) in place 2006  . Myocardial infarction 2006  . Pacemaker 2006  . Cardiac arrest 2006     etiology unknown, s/p dual chamber pacemaker & not AICD for ? 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 Complete heart block   . Shortness of breath     "laying down" (11/20/2012)  . Type II diabetes mellitus   . Arthritis     "fingers" (11/20/2012)  . Depression     "lost alot of family this year" (11/20/2012)  . Chronic renal insufficiency, stage III (moderate)     Etiology unknown  . CKD (chronic kidney  disease) stage 3, GFR 30-59 ml/min 11/20/2012    Past Surgical History  Procedure Laterality Date  . Tubal ligation  1970's  . Insert / replace / remove pacemaker  04/10/2005    Dual chamber pacemaker, for secondary prevention cardiac arrest  . Cardiac defibrillator placement  2005/04/10    Family History  Problem Relation Age of Onset  . Heart disease Brother   . Diabetes Mother   . Diabetes Father     History   Social History  . Marital Status: Widowed    Spouse Name: N/A    Number of Children: N/A  . Years of Education: N/A   Occupational History  . Not on file.   Social History Main  Topics  . Smoking status: Former Smoker -- 2.00 packs/day for 45 years    Types: Cigarettes  . Smokeless tobacco: Never Used     Comment: 11/20/2012 "quit smoking in the 1990's"  . Alcohol Use: No     Comment: 11/20/2012 "used to drink quite a bit; quit drinking ~ 1990's"   . Drug Use: No  . Sexual Activity: Not Currently   Other Topics Concern  . Not on file   Social History Narrative   Pt is widowed x2. 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 Moved here in July 2013 to be closer to daughter. One son died of MI earlier 04/10/2012 and another was murdered later this summer. 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 3 pack year smoking history. Lives in [place] on her own, near daughter.     Review of systems: The patient specifically denies any chest pain at rest or with exertion, dyspnea at rest or with usual exertion, orthopnea, paroxysmal nocturnal dyspnea, syncope, palpitations, focal neurological deficits, intermittent claudication, lower extremity edema, unexplained weight gain, cough, hemoptysis or wheezing.  The patient also denies abdominal pain, nausea, vomiting, dysphagia, diarrhea, constipation, polyuria, polydipsia, dysuria, hematuria, frequency, urgency, abnormal bleeding or bruising, fever, chills, unexpected weight changes, mood swings, change in skin or hair texture, change in voice quality, auditory or visual problems, allergic reactions or rashes, new musculoskeletal complaints other than usual "aches and pains".   PHYSICAL EXAM BP 119/64  Pulse 72  Resp 20  Ht 5\' 3"  (1.6 m)  Wt 147 lb 4.8 oz (66.815 kg)  BMI 26.10 kg/m2 General appearance: alert, cooperative and no distress  Neck: JVD - 6 cm above sternal notch, no adenopathy, no carotid bruit, supple, symmetrical, trachea midline and thyroid not enlarged, symmetric, no tenderness/mass/nodules  Lungs: clear to auscultation bilaterally  Heart: regular rate and rhythm, S1: normal, S2: paradoxically  splitting, systolic murmur: early systolic 2/6, crescendo and decrescendo at 2nd right intercostal space, 2nd systolic murmur: holosystolic 2/6, blowing at apex, diastolic murmur: holodiastolic 3/6, decrescendo at 2nd right intercostal space and no rub  Abdomen: soft, non-tender; bowel sounds normal; no masses, no organomegaly  Extremities: edema 1-2+ ankle  Pulses: 2+ and symmetric  Skin: Skin color, texture, turgor normal. No rashes or lesions  Neurologic: Alert and oriented X 3, normal strength and tone. Normal symmetric reflexes. Normal coordination and gait    EKG: Atrial sensed ventricular paced ECHO - Left ventricle: The cavity size was mildly dilated. There was mild concentric hypertrophy. Systolic function was severely reduced. The estimated ejection fraction was in the range of 20% to 25%. Diffuse hypokinesis with asynchronous overall contraction. Features are consistent with a pseudonormal left ventricular filling pattern, with concomitant abnormal relaxation and increased filling pressure (grade 2  diastolic dysfunction). - Mitral valve: Calcified annulus. Mildly thickened leaflets . Mild regurgitation. - Right ventricle: The cavity size was mildly dilated. Wall thickness was normal. - Pulmonary arteries: Systolic pressure was mildly increased. PA peak pressure: 38mm Hg (S).  Lipid Panel     Component Value Date/Time   CHOL 114 10/01/2012 1355   TRIG 66 10/01/2012 1355   HDL 31* 10/01/2012 1355   CHOLHDL 3.7 10/01/2012 1355   VLDL 13 10/01/2012 1355   LDLCALC 70 10/01/2012 1355    BMET    Component Value Date/Time   NA 131* 10/15/2013 0555   K 4.1 10/15/2013 0555   CL 96 10/15/2013 0555   CO2 23 10/15/2013 0555   GLUCOSE 253* 10/15/2013 0555   BUN 43* 10/15/2013 0555   CREATININE 1.74* 10/15/2013 0555   CREATININE 1.80* 10/11/2013 1108   CALCIUM 9.1 10/15/2013 0555   GFRNONAA 28* 10/15/2013 0555   GFRAA 32* 10/15/2013 0555     ASSESSMENT AND PLAN Acute  on chronic systolic CHF (congestive heart failure) She became hypovolemic due to the combination of ongoing treatment with loop diuretics and obligatory diuresis of severe hyperglycemia due to poorly treated diabetes mellitus. She now appears to be as close to euvolemic as I can ascertain. Her blood-pressure allows a little improvement in vasodilator therapy. She will increase the hydralazine to 50 mg 3 times a day isosorbide dinitrate 20 mg 3 times a day. I am reluctant to make any changes in her beta blocker therapy since this may increase the likelihood of hypoglycemia unawareness and she is now on more aggressive treatment with insulin.  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 If there is not a substantial improvement in left ventricular ejection fraction, she would benefit from cardiac resynchronization therapy. We have already discussed the indications for CRT and defibrillatory therapies. She has thought about her options and at least at this point she wants "the whole deal", meaning she wants a defibrillator as well as biventricular pacing. I have tried to point out to her the distinction between quality of life interventions and length of life interventions, gently pointing out that she is in her mid 12s. Her daughter has been present at all of these meetings, including today and seems to agree with her mother's decision.  Patient Instructions    Increase Hydralazine to 50mg  three times a day.  Increase Isosorbide 10mg  to 2 tablets three times a day.  Your physician recommends that you schedule a follow-up appointment in: One Month along with a pacemaker check.    Meds ordered this encounter  Medications  . DISCONTD: isosorbide dinitrate (ISORDIL) 10 MG tablet    Sig: Take 10 mg by mouth 3 (three) times daily.  . isosorbide dinitrate (ISORDIL) 10  MG tablet    Sig: Take 2 tablets (20 mg total) by mouth 3 (three) times daily.    Dispense:  180 tablet    Refill:  6  . hydrALAZINE (APRESOLINE) 25 MG tablet    Sig: Take 2 tablets (50 mg total) by mouth 3 (three) times daily.    Dispense:  180 tablet    Refill:  6    Order Specific Question:  Supervising Provider    Answer:  Nicki Guadalajara A [[id number]]    Junious Silk, MD, [facility] [phone] office [phone] pager

## 2013-10-28 NOTE — Assessment & Plan Note (Signed)
She became hypovolemic due to the combination of ongoing treatment with loop diuretics and obligatory diuresis of severe hyperglycemia due to poorly treated diabetes mellitus. She now appears to be as close to euvolemic as I can ascertain. Her blood-pressure allows a little improvement in vasodilator therapy. She will increase the hydralazine to 50 mg 3 times a day isosorbide dinitrate 20 mg 3 times a day. I am reluctant to make any changes in her beta blocker therapy since this may increase the likelihood of hypoglycemia unawareness and she is now on more aggressive treatment with insulin.

## 2013-10-30 LAB — MDC_IDC_ENUM_SESS_TYPE_INCLINIC
Battery Impedance: 4978 Ohm
Battery Remaining Longevity: 5 mo
Battery Voltage: 2.64 V
Brady Statistic AP VP Percent: 1.1 %
Brady Statistic AP VS Percent: 0.1 % — CL
Brady Statistic AS VP Percent: 97.9 %
Brady Statistic AS VS Percent: 0.9 %
Lead Channel Impedance Value: 422 Ohm
Lead Channel Impedance Value: 615 Ohm
Lead Channel Pacing Threshold Amplitude: 0.5 V
Lead Channel Pacing Threshold Amplitude: 0.75 V
Lead Channel Pacing Threshold Pulse Width: 0.4 ms
Lead Channel Pacing Threshold Pulse Width: 0.4 ms
Lead Channel Sensing Intrinsic Amplitude: 4 mV
Lead Channel Setting Pacing Amplitude: 1.5 V
Lead Channel Setting Pacing Amplitude: 2 V
Lead Channel Setting Pacing Pulse Width: 0.4 ms
Lead Channel Setting Sensing Sensitivity: 2.8 mV

## 2013-11-24 ENCOUNTER — Encounter: Payer: Self-pay | Admitting: *Deleted

## 2013-11-27 ENCOUNTER — Encounter: Payer: Self-pay | Admitting: Cardiovascular Disease

## 2013-11-27 ENCOUNTER — Ambulatory Visit (INDEPENDENT_AMBULATORY_CARE_PROVIDER_SITE_OTHER): Payer: Medicare Other | Admitting: Cardiovascular Disease

## 2013-11-27 VITALS — BP 152/74 | HR 76 | Resp 20 | Ht 63.0 in | Wt 145.0 lb

## 2013-11-27 DIAGNOSIS — I509 Heart failure, unspecified: Secondary | ICD-10-CM

## 2013-11-27 DIAGNOSIS — I428 Other cardiomyopathies: Secondary | ICD-10-CM

## 2013-11-27 DIAGNOSIS — I429 Cardiomyopathy, unspecified: Secondary | ICD-10-CM

## 2013-11-27 DIAGNOSIS — N183 Chronic kidney disease, stage 3 unspecified: Secondary | ICD-10-CM

## 2013-11-27 DIAGNOSIS — I442 Atrioventricular block, complete: Secondary | ICD-10-CM

## 2013-11-27 DIAGNOSIS — I5023 Acute on chronic systolic (congestive) heart failure: Secondary | ICD-10-CM

## 2013-11-27 LAB — PACEMAKER DEVICE OBSERVATION

## 2013-11-27 LAB — MDC_IDC_ENUM_SESS_TYPE_INCLINIC
Battery Impedance: 5137 Ohm
Brady Statistic AP VP Percent: 5 %
Brady Statistic AP VS Percent: 0.1 % — CL
Brady Statistic AS VS Percent: 1.4 %
Lead Channel Pacing Threshold Amplitude: 0.5 V
Lead Channel Sensing Intrinsic Amplitude: 2.8 mV
Lead Channel Setting Pacing Amplitude: 2 V
Lead Channel Setting Pacing Pulse Width: 0.4 ms
MDC IDC MSMT BATTERY REMAINING LONGEVITY: 5 mo
MDC IDC MSMT BATTERY VOLTAGE: 2.62 V
MDC IDC MSMT LEADCHNL RA IMPEDANCE VALUE: 458 Ohm
MDC IDC MSMT LEADCHNL RA PACING THRESHOLD PULSEWIDTH: 0.4 ms
MDC IDC MSMT LEADCHNL RV IMPEDANCE VALUE: 650 Ohm
MDC IDC MSMT LEADCHNL RV PACING THRESHOLD AMPLITUDE: 0.625 V
MDC IDC MSMT LEADCHNL RV PACING THRESHOLD PULSEWIDTH: 0.4 ms
MDC IDC SET LEADCHNL RA PACING AMPLITUDE: 1.5 V
MDC IDC SET LEADCHNL RV SENSING SENSITIVITY: 2.8 mV
MDC IDC STAT BRADY AS VP PERCENT: 93.6 %

## 2013-11-27 NOTE — Assessment & Plan Note (Signed)
The etiology of her cardiomyopathy is most likely ischemic since she has evidence of wall motion abnormalities with a regional distribution. She has never had angina pectoris and her nuclear stress test did not show reversible ischemia suggesting that she has a completed anterior wall infarction she was not aware of. There is some disincentive to performing coronary angiography since she has significant kidney disease. If her repeat echocardiogram does not show substantial improvement in LV function, she may benefit from biventricular pacing. She also wants a defibrillator. If this is the case, we will have to perform coronary angiography with a small amount of contrast before upgrading her device. On the other hand, if left ventricular systolic function improves substantially just with medical therapy, I would probably advise against coronary angiography due to the risk of renal dysfunction.

## 2013-11-27 NOTE — Patient Instructions (Addendum)
Your physician has requested that you have an echocardiogram in 3 weeks. Echocardiography is a painless test that uses sound waves to create images of your heart. It provides your doctor with information about the size and shape of your heart and how well your heart's chambers and valves are working. This procedure takes approximately one hour. There are no restrictions for this procedure.  Your physician recommends that you schedule a follow-up appointment in: 1 month with Dr.Croitoru + pacemaker check.

## 2013-11-27 NOTE — Progress Notes (Signed)
Patient ID: [name] [name], female   DOB: 1941-08-11, 73 y.o.   MRN: [id number]      Reason for office visit Congestive heart failure (combined systolic and diastolic), complete heart block/permanent pacemaker  She returns in followup after adjustment in her heart failure medications. She has not had problems with hypotension and dizziness and typically her blood pressure has been in the low normal range. It is elevated today but she was in a hurry to return appointment and did not take her morning medications. She has not had problems of shortness of breath. She's making progress with her ambulation after working with a physical therapist. She is now more independent but still uses a walker. She is intent on returning to [place] for a trip in April or May of this year.  She is now need her to receive a new pacemaker or defibrillator. She is due to have a followup echocardiogram after we optimize her medications, towards the end of this month. She has not had problems with dyspnea or edema. 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 Angiography has been avoided because of advanced chronic kidney disease with a recent episode of acute renal failure.   Allergies  Allergen Reactions  . Codeine Nausea Only  . Statins Diarrhea    Current Outpatient Prescriptions  Medication Sig Dispense Refill  . albuterol (PROVENTIL HFA;VENTOLIN HFA) 108 (90 BASE) MCG/ACT inhaler Inhale 2 puffs into the lungs every 6 (six) hours as needed. For wheezing  1 Inhaler  6  . amitriptyline (ELAVIL) 10 MG tablet Take 10 mg by mouth at bedtime.       . furosemide (LASIX) 40 MG tablet Take 1 tablet (40 mg total) by mouth daily.  30 tablet  0  . hydrALAZINE (APRESOLINE) 25 MG tablet Take 2 tablets (50 mg total) by mouth 3 (three) times daily.  180 tablet  6  . insulin glargine (LANTUS) 100 UNIT/ML injection Inject 0.35 mLs  (35 Units total) into the skin 2 (two) times daily. Take 30 units in the morning or 35 units in the evening  10 mL  12  . isosorbide dinitrate (ISORDIL) 10 MG tablet Take 2 tablets (20 mg total) by mouth 3 (three) times daily.  180 tablet  6  . metoprolol tartrate (LOPRESSOR) 25 MG tablet Take 25 mg by mouth 2 (two) times daily.      . naproxen sodium (ANAPROX) 220 MG tablet Take 440 mg by mouth daily as needed. For pain      . sitaGLIPtin (JANUVIA) 50 MG tablet Take 0.5 tablets (25 mg total) by mouth daily.      Marland Kitchen. spironolactone (ALDACTONE) 25 MG tablet Take 0.5 tablets (12.5 mg total) by mouth daily.       No current facility-administered medications for this visit.    Past Medical History  Diagnosis Date  . Hypertension   . CHF (congestive heart failure)   . Hyperlipidemia   . Nonischemic cardiomyopathy     Hattie Perch/notes 11/20/2012  . ICD (implantable cardiac defibrillator) in place 2006  . Myocardial infarction 2006  . Pacemaker 2006  . Cardiac arrest 2006     etiology unknown, s/p dual chamber pacemaker & not AICD for ? 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 Complete heart block   . Shortness of breath     "laying down" (11/20/2012)  . Type II diabetes mellitus   . Arthritis     "  fingers" (11/20/2012)  . Depression     "lost alot of family this year" (11/20/2012)  . Chronic renal insufficiency, stage III (moderate)     Etiology unknown  . CKD (chronic kidney disease) stage 3, GFR 30-59 ml/min 11/20/2012    Past Surgical History  Procedure Laterality Date  . Tubal ligation  1970's  . Insert / replace / remove pacemaker  Apr 06, 2005    Dual chamber pacemaker, for secondary prevention cardiac arrest  . Cardiac defibrillator placement  04-06-2005    Medtronic    Family History  Problem Relation Age of Onset  . Heart disease Brother   . Diabetes Mother   . Diabetes Father     History   Social History  . Marital Status: Widowed    Spouse Name: N/A    Number of Children: N/A  . Years of Education: N/A    Occupational History  . Not on file.   Social History Main Topics  . Smoking status: Former Smoker -- 2.00 packs/day for 45 years    Types: Cigarettes  . Smokeless tobacco: Never Used     Comment: 11/20/2012 "quit smoking in the 1990's"  . Alcohol Use: No     Comment: 11/20/2012 "used to drink quite a bit; quit drinking ~ 1990's"   . Drug Use: No  . Sexual Activity: Not Currently   Other Topics Concern  . Not on file   Social History Narrative   Pt is widowed x2. 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 Moved here in July 2013 to be closer to daughter. One son died of MI earlier 04/06/2012 and another was murdered later this summer. 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 3 pack year smoking history. Lives in [place] on her own, near daughter.     Review of systems: The patient specifically denies any chest pain at rest or with exertion, dyspnea at rest or with usual exertion, orthopnea, paroxysmal nocturnal dyspnea, syncope, palpitations, focal neurological deficits, intermittent claudication, lower extremity edema, unexplained weight gain, cough, hemoptysis or wheezing.  The patient also denies abdominal pain, nausea, vomiting, dysphagia, diarrhea, constipation, polyuria, polydipsia, dysuria, hematuria, frequency, urgency, abnormal bleeding or bruising, fever, chills, unexpected weight changes, mood swings, change in skin or hair texture, change in voice quality, auditory or visual problems, allergic reactions or rashes, new musculoskeletal complaints other than usual "aches and pains".   PHYSICAL EXAM BP 152/74  Pulse 76  Resp 20  Ht 5\' 3"  (1.6 m)  Wt 145 lb (65.772 kg)  BMI 25.69 kg/m2 General appearance: alert, cooperative and no distress  Neck: JVD - 6 cm above sternal notch, no adenopathy, no carotid bruit, supple, symmetrical, trachea midline and thyroid not enlarged, symmetric, no tenderness/mass/nodules  Lungs: clear to auscultation bilaterally  Heart:  regular rate and rhythm, S1: normal, S2: paradoxically splitting, systolic murmur: early systolic 2/6, crescendo and decrescendo at 2nd right intercostal space, 2nd systolic murmur: holosystolic 2/6, blowing at apex, diastolic murmur: holodiastolic 3/6, decrescendo at 2nd right intercostal space and no rub  Abdomen: soft, non-tender; bowel sounds normal; no masses, no organomegaly  Extremities: edema 1-2+ ankle  Pulses: 2+ and symmetric  Skin: Skin color, texture, turgor normal. No rashes or lesions  Neurologic: Alert and oriented X 3, normal strength and tone. Normal symmetric reflexes. Normal coordination and slow unsteady gait   EKG: Atrial sensed ventricular paced   ECHO  - Left ventricle: The cavity size was mildly dilated. There was mild concentric hypertrophy. Systolic function  was severely reduced. The estimated ejection fraction was in the range of 20% to 25%. Diffuse hypokinesis with asynchronous overall contraction. Features are consistent with a pseudonormal left ventricular filling pattern, with concomitant abnormal relaxation and increased filling pressure (grade 2 diastolic dysfunction). - Mitral valve: Calcified annulus. Mildly thickened leaflets. Mild regurgitation. - Right ventricle: The cavity size was mildly dilated. - Pulmonary arteries: Systolic pressure was mildly increased. PA peak pressure: 38mm Hg (S).      Lipid Panel     Component Value Date/Time   CHOL 114 10/01/2012 1355   TRIG 66 10/01/2012 1355   HDL 31* 10/01/2012 1355   CHOLHDL 3.7 10/01/2012 1355   VLDL 13 10/01/2012 1355   LDLCALC 70 10/01/2012 1355    BMET    Component Value Date/Time   NA 131* 10/15/2013 0555   K 4.1 10/15/2013 0555   CL 96 10/15/2013 0555   CO2 23 10/15/2013 0555   GLUCOSE 253* 10/15/2013 0555   BUN 43* 10/15/2013 0555   CREATININE 1.74* 10/15/2013 0555   CREATININE 1.80* 10/11/2013 1108   CALCIUM 9.1 10/15/2013 0555   GFRNONAA 28* 10/15/2013 0555   GFRAA 32*  10/15/2013 0555     ASSESSMENT AND PLAN Cardiomyopathy, (? ischemic AS AK on echo), EF 25% The etiology of her cardiomyopathy is most likely ischemic since she has evidence of wall motion abnormalities with a regional distribution. She has never had angina pectoris and her nuclear stress test did not show reversible ischemia suggesting that she has a completed anterior wall infarction she was not aware of. There is some disincentive to performing coronary angiography since she has significant kidney disease. If her repeat echocardiogram does not show substantial improvement in LV function, she may benefit from biventricular pacing. She also wants a defibrillator. If this is the case, we will have to perform coronary angiography with a small amount of contrast before upgrading her device. On the other hand, if left ventricular systolic function improves substantially just with medical therapy, I would probably advise against coronary angiography due to the risk of renal dysfunction.  CKD (chronic kidney disease) stage 3, GFR 30-59 ml/min    Acute on chronic systolic CHF (congestive heart failure) Currently she appears clinically euvolemic. NYHA functional class 2-3, a little difficult to assess secondary to her difficult ambulation. She has started to walk without a walker after receiving physical therapy. The blood pressure is a little high today but she states that she did not take her medications this morning being in a hurry to get here. At home her typical blood pressure has been in the 120s. No changes are made to her medicines today .   Complete heart block s/p dual chamber Medtronic pacemaker 2006 She is pacemaker dependent secondary to complete heart block. Her device is expected to reach ERI in the next 5 months. A decision regarding upgrade to CRT P. or CRT-D should be made before a generator change out. After she has her followup echocardiogram, we'll refer her to the electrophysiology to  discuss this.  Orders Placed This Encounter  Procedures  . 2D Echocardiogram with contrast   No orders of the defined types were placed in this encounter.    Junious Silk, MD, [facility] [phone] office [phone] pager

## 2013-11-27 NOTE — Assessment & Plan Note (Signed)
She is pacemaker dependent secondary to complete heart block. Her device is expected to reach ERI in the next 5 months. A decision regarding upgrade to CRT P. or CRT-D should be made before a generator change out. After she has her followup echocardiogram, we'll refer her to the electrophysiology to discuss this.

## 2013-11-27 NOTE — Assessment & Plan Note (Signed)
Currently she appears clinically euvolemic. NYHA functional class 2-3, a little difficult to assess secondary to her difficult ambulation. She has started to walk without a walker after receiving physical therapy. The blood pressure is a little high today but she states that she did not take her medications this morning being in a hurry to get here. At home her typical blood pressure has been in the 120s. No changes are made to her medicines today .

## 2013-12-09 ENCOUNTER — Emergency Department (HOSPITAL_BASED_OUTPATIENT_CLINIC_OR_DEPARTMENT_OTHER): Payer: Medicare Other

## 2013-12-09 ENCOUNTER — Inpatient Hospital Stay (HOSPITAL_BASED_OUTPATIENT_CLINIC_OR_DEPARTMENT_OTHER)
Admission: AD | Admit: 2013-12-09 | Discharge: 2013-12-13 | DRG: 690 | Disposition: A | Discharge status: Home / Self Care | Payer: Medicare Other | Attending: Family Medicine | Admitting: Family Medicine

## 2013-12-09 ENCOUNTER — Encounter (HOSPITAL_BASED_OUTPATIENT_CLINIC_OR_DEPARTMENT_OTHER): Payer: Self-pay | Admitting: Emergency Medicine

## 2013-12-09 DIAGNOSIS — Z95810 Presence of automatic (implantable) cardiac defibrillator: Secondary | ICD-10-CM

## 2013-12-09 DIAGNOSIS — N390 Urinary tract infection, site not specified: Principal | ICD-10-CM

## 2013-12-09 DIAGNOSIS — N179 Acute kidney failure, unspecified: Secondary | ICD-10-CM

## 2013-12-09 DIAGNOSIS — R1110 Vomiting, unspecified: Secondary | ICD-10-CM | POA: Diagnosis present

## 2013-12-09 DIAGNOSIS — N183 Chronic kidney disease, stage 3 unspecified: Secondary | ICD-10-CM

## 2013-12-09 DIAGNOSIS — I429 Cardiomyopathy, unspecified: Secondary | ICD-10-CM | POA: Diagnosis present

## 2013-12-09 DIAGNOSIS — I252 Old myocardial infarction: Secondary | ICD-10-CM

## 2013-12-09 DIAGNOSIS — I5023 Acute on chronic systolic (congestive) heart failure: Secondary | ICD-10-CM

## 2013-12-09 DIAGNOSIS — I509 Heart failure, unspecified: Secondary | ICD-10-CM | POA: Diagnosis present

## 2013-12-09 DIAGNOSIS — E860 Dehydration: Secondary | ICD-10-CM

## 2013-12-09 DIAGNOSIS — E871 Hypo-osmolality and hyponatremia: Secondary | ICD-10-CM

## 2013-12-09 DIAGNOSIS — I38 Endocarditis, valve unspecified: Secondary | ICD-10-CM

## 2013-12-09 DIAGNOSIS — I10 Essential (primary) hypertension: Secondary | ICD-10-CM

## 2013-12-09 DIAGNOSIS — E119 Type 2 diabetes mellitus without complications: Secondary | ICD-10-CM

## 2013-12-09 DIAGNOSIS — Z794 Long term (current) use of insulin: Secondary | ICD-10-CM

## 2013-12-09 DIAGNOSIS — Z8674 Personal history of sudden cardiac arrest: Secondary | ICD-10-CM

## 2013-12-09 DIAGNOSIS — I5042 Chronic combined systolic (congestive) and diastolic (congestive) heart failure: Secondary | ICD-10-CM | POA: Diagnosis present

## 2013-12-09 DIAGNOSIS — I129 Hypertensive chronic kidney disease with stage 1 through stage 4 chronic kidney disease, or unspecified chronic kidney disease: Secondary | ICD-10-CM | POA: Diagnosis present

## 2013-12-09 DIAGNOSIS — R509 Fever, unspecified: Secondary | ICD-10-CM | POA: Diagnosis present

## 2013-12-09 DIAGNOSIS — E785 Hyperlipidemia, unspecified: Secondary | ICD-10-CM

## 2013-12-09 DIAGNOSIS — A498 Other bacterial infections of unspecified site: Secondary | ICD-10-CM | POA: Diagnosis present

## 2013-12-09 DIAGNOSIS — Z833 Family history of diabetes mellitus: Secondary | ICD-10-CM

## 2013-12-09 DIAGNOSIS — I2589 Other forms of chronic ischemic heart disease: Secondary | ICD-10-CM | POA: Diagnosis present

## 2013-12-09 DIAGNOSIS — I428 Other cardiomyopathies: Secondary | ICD-10-CM

## 2013-12-09 DIAGNOSIS — I442 Atrioventricular block, complete: Secondary | ICD-10-CM

## 2013-12-09 DIAGNOSIS — Z87891 Personal history of nicotine dependence: Secondary | ICD-10-CM

## 2013-12-09 DIAGNOSIS — E1100 Type 2 diabetes mellitus with hyperosmolarity without nonketotic hyperglycemic-hyperosmolar coma (NKHHC): Secondary | ICD-10-CM

## 2013-12-09 DIAGNOSIS — Z8249 Family history of ischemic heart disease and other diseases of the circulatory system: Secondary | ICD-10-CM

## 2013-12-09 DIAGNOSIS — R748 Abnormal levels of other serum enzymes: Secondary | ICD-10-CM

## 2013-12-09 LAB — CBC WITH DIFFERENTIAL/PLATELET
BASOS PCT: 0 % (ref 0–1)
Basophils Absolute: 0 10*3/uL (ref 0.0–0.1)
EOS ABS: 0.1 10*3/uL (ref 0.0–0.7)
Eosinophils Relative: 1 % (ref 0–5)
HCT: 36.6 % (ref 36.0–46.0)
Hemoglobin: 12.2 g/dL (ref 12.0–15.0)
Lymphocytes Relative: 4 % — ABNORMAL LOW (ref 12–46)
Lymphs Abs: 0.7 10*3/uL (ref 0.7–4.0)
MCH: 26.6 pg (ref 26.0–34.0)
MCHC: 33.3 g/dL (ref 30.0–36.0)
MCV: 79.7 fL (ref 78.0–100.0)
Monocytes Absolute: 1 10*3/uL (ref 0.1–1.0)
Monocytes Relative: 6 % (ref 3–12)
NEUTROS PCT: 90 % — AB (ref 43–77)
Neutro Abs: 16.4 10*3/uL — ABNORMAL HIGH (ref 1.7–7.7)
Platelets: 281 10*3/uL (ref 150–400)
RBC: 4.59 MIL/uL (ref 3.87–5.11)
RDW: 17.4 % — ABNORMAL HIGH (ref 11.5–15.5)
WBC: 18.2 10*3/uL — ABNORMAL HIGH (ref 4.0–10.5)

## 2013-12-09 LAB — CG4 I-STAT (LACTIC ACID): Lactic Acid, Venous: 1.08 mmol/L (ref 0.5–2.2)

## 2013-12-09 LAB — URINALYSIS, ROUTINE W REFLEX MICROSCOPIC
BILIRUBIN URINE: NEGATIVE
Glucose, UA: NEGATIVE mg/dL
KETONES UR: NEGATIVE mg/dL
Nitrite: NEGATIVE
Specific Gravity, Urine: 1.017 (ref 1.005–1.030)
UROBILINOGEN UA: 0.2 mg/dL (ref 0.0–1.0)
pH: 6 (ref 5.0–8.0)

## 2013-12-09 LAB — URINE MICROSCOPIC-ADD ON

## 2013-12-09 LAB — BASIC METABOLIC PANEL
BUN: 43 mg/dL — AB (ref 6–23)
CO2: 25 mEq/L (ref 19–32)
Calcium: 8.9 mg/dL (ref 8.4–10.5)
Chloride: 102 mEq/L (ref 96–112)
Creatinine, Ser: 1.9 mg/dL — ABNORMAL HIGH (ref 0.50–1.10)
GFR, EST AFRICAN AMERICAN: 29 mL/min — AB (ref >90)
GFR, EST NON AFRICAN AMERICAN: 25 mL/min — AB (ref >90)
Glucose, Bld: 151 mg/dL — ABNORMAL HIGH (ref 70–99)
POTASSIUM: 4.8 meq/L (ref 3.7–5.3)
Sodium: 140 mEq/L (ref 137–147)

## 2013-12-09 LAB — LIPASE, BLOOD: Lipase: 42 U/L (ref 11–59)

## 2013-12-09 MED ORDER — ONDANSETRON HCL 4 MG PO TABS
4.0000 mg | ORAL_TABLET | Freq: Four times a day (QID) | ORAL | Status: DC | PRN
Start: 2013-12-09 — End: 2013-12-13

## 2013-12-09 MED ORDER — METOPROLOL TARTRATE 25 MG PO TABS
25.0000 mg | ORAL_TABLET | Freq: Two times a day (BID) | ORAL | Status: DC
  Administered 2013-12-10: 25 mg via ORAL
  Filled 2013-12-09 (×2): qty 1

## 2013-12-09 MED ORDER — HEPARIN SODIUM (PORCINE) 5000 UNIT/ML IJ SOLN
5000.0000 [IU] | Freq: Three times a day (TID) | INTRAMUSCULAR | Status: DC
  Administered 2013-12-10 (×2): 5000 [IU] via SUBCUTANEOUS
  Filled 2013-12-09 (×5): qty 1

## 2013-12-09 MED ORDER — ONDANSETRON HCL 4 MG/2ML IJ SOLN
4.0000 mg | Freq: Four times a day (QID) | INTRAMUSCULAR | Status: DC | PRN
Start: 2013-12-09 — End: 2013-12-13
  Administered 2013-12-11 – 2013-12-12 (×2): 4 mg via INTRAVENOUS
  Filled 2013-12-09 (×2): qty 2

## 2013-12-09 MED ORDER — INSULIN GLARGINE 100 UNIT/ML ~~LOC~~ SOLN
30.0000 [IU] | Freq: Two times a day (BID) | SUBCUTANEOUS | Status: DC
  Administered 2013-12-10 (×2): 30 [IU] via SUBCUTANEOUS
  Filled 2013-12-09 (×4): qty 0.3

## 2013-12-09 MED ORDER — ISOSORBIDE DINITRATE 20 MG PO TABS
20.0000 mg | ORAL_TABLET | Freq: Three times a day (TID) | ORAL | Status: DC
  Administered 2013-12-10 (×2): 20 mg via ORAL
  Filled 2013-12-09 (×4): qty 1

## 2013-12-09 MED ORDER — AMITRIPTYLINE HCL 10 MG PO TABS
10.0000 mg | ORAL_TABLET | Freq: Every day | ORAL | Status: DC
  Administered 2013-12-10 – 2013-12-12 (×4): 10 mg via ORAL
  Filled 2013-12-09 (×5): qty 1

## 2013-12-09 MED ORDER — SODIUM CHLORIDE 0.9 % IV BOLUS (SEPSIS)
500.0000 mL | Freq: Once | INTRAVENOUS | Status: AC
  Administered 2013-12-09: 500 mL via INTRAVENOUS

## 2013-12-09 MED ORDER — HYDRALAZINE HCL 50 MG PO TABS
50.0000 mg | ORAL_TABLET | Freq: Three times a day (TID) | ORAL | Status: DC
  Administered 2013-12-10 (×2): 50 mg via ORAL
  Filled 2013-12-09 (×4): qty 1

## 2013-12-09 MED ORDER — SODIUM CHLORIDE 0.9 % IJ SOLN
3.0000 mL | Freq: Two times a day (BID) | INTRAMUSCULAR | Status: DC
  Administered 2013-12-10 – 2013-12-13 (×5): 3 mL via INTRAVENOUS

## 2013-12-09 MED ORDER — ACETAMINOPHEN 650 MG RE SUPP: 650.0000 mg | Freq: Four times a day (QID) | RECTAL | Status: DC | PRN

## 2013-12-09 MED ORDER — SPIRONOLACTONE 12.5 MG HALF TABLET
12.5000 mg | ORAL_TABLET | Freq: Every day | ORAL | Status: DC
  Filled 2013-12-09: qty 1

## 2013-12-09 MED ORDER — SODIUM CHLORIDE 0.9 % IV SOLN
Freq: Once | INTRAVENOUS | Status: AC
  Administered 2013-12-09: 18:00:00 via INTRAVENOUS

## 2013-12-09 MED ORDER — ACETAMINOPHEN 325 MG PO TABS
650.0000 mg | ORAL_TABLET | Freq: Four times a day (QID) | ORAL | Status: DC | PRN
  Administered 2013-12-11: 650 mg via ORAL
  Filled 2013-12-09: qty 2

## 2013-12-09 MED ORDER — PANTOPRAZOLE SODIUM 40 MG PO TBEC
40.0000 mg | DELAYED_RELEASE_TABLET | Freq: Every day | ORAL | Status: DC
  Administered 2013-12-10 – 2013-12-13 (×4): 40 mg via ORAL
  Filled 2013-12-09 (×4): qty 1

## 2013-12-09 MED ORDER — DEXTROSE 5 % IV SOLN
1.0000 g | Freq: Once | INTRAVENOUS | Status: AC
  Administered 2013-12-09: 1 g via INTRAVENOUS

## 2013-12-09 MED ORDER — CEFTRIAXONE SODIUM 1 G IJ SOLR
INTRAMUSCULAR | Status: AC
  Filled 2013-12-09: qty 10

## 2013-12-09 MED ORDER — TRAMADOL-ACETAMINOPHEN 37.5-325 MG PO TABS
1.0000 | ORAL_TABLET | Freq: Three times a day (TID) | ORAL | Status: DC | PRN
  Administered 2013-12-10 (×2): 1 via ORAL
  Filled 2013-12-09 (×2): qty 1

## 2013-12-09 MED ORDER — ALBUTEROL SULFATE HFA 108 (90 BASE) MCG/ACT IN AERS: 2.0000 | INHALATION_SPRAY | Freq: Four times a day (QID) | RESPIRATORY_TRACT | Status: DC | PRN

## 2013-12-09 NOTE — ED Notes (Signed)
Per EMS, patient started vomiting this afternoon, given 4mg  Zofran by EMS, no c/o nausea at this time, glucose 129 on truck.

## 2013-12-09 NOTE — H&P (Signed)
[facility] History and Physical  Patient: [name] [name]  OFH:[id number]  DOB: [birth year]  DOS: the patient was seen and examined on 12/09/2013 PCP: Doreatha Martin, MD  Chief Complaint: Vomiting and chills  HPI: [name] [name] is a 73 y.o. female with Past medical history of hypertension, nonischemic cardiomyopathy status post ICD implantation, CHF, dyslipidemia, history of diabetes. The patient is coming from home. The patient presented with complaints of vomiting. History has been obtained from both daughter as well as the patient. He mentioned that she has been having complaints of burning urination since last few days and hematuria since last 3-4 days. Patient denies any complaint of flank pain or abdominal pain. She denies any fever but felt chilly. And today she started having episodes of vomiting. There is no change in her medication. She is compliant with all her medications. She scheduled for an echocardiogram as well as for an ICD battery replacement in coming month. She has been taking naproxen daily 2 tablets in the morning.  Review of Systems: as mentioned in the history of present illness.  A Comprehensive review of the other systems is negative.  Past Medical History  Diagnosis Date  . Hypertension   . CHF (congestive heart failure)   . Hyperlipidemia   . Nonischemic cardiomyopathy     Hattie Perch 11/20/2012  . ICD (implantable cardiac defibrillator) in place 2006  . Myocardial infarction 2006  . Pacemaker 2006  . Cardiac arrest 2006     etiology unknown, s/p dual chamber pacemaker & not AICD for ? 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 Complete heart block   . Shortness of breath     "laying down" (11/20/2012)  . Type II diabetes mellitus   . Arthritis     "fingers" (11/20/2012)  . Depression     "lost alot of family this year" (11/20/2012)  . Chronic renal insufficiency, stage III (moderate)     Etiology unknown  . CKD (chronic kidney disease) stage 3, GFR 30-59 ml/min  11/20/2012   Past Surgical History  Procedure Laterality Date  . Tubal ligation  1970's  . Insert / replace / remove pacemaker  2006    Dual chamber pacemaker, for secondary prevention cardiac arrest  . Cardiac defibrillator placement  2006    [employer]   Social History:  reports that she has quit smoking. Her smoking use included Cigarettes. She has a 90 pack-year smoking history. She has never used smokeless tobacco. She reports that she does not drink alcohol or use illicit drugs. Independent for most of her  ADL.  Allergies  Allergen Reactions  . Codeine Nausea Only  . Statins Diarrhea    Family History  Problem Relation Age of Onset  . Heart disease Brother   . Diabetes Mother   . Diabetes Father     Prior to Admission medications   Medication Sig Start Date End Date Taking? Authorizing Provider  albuterol (PROVENTIL HFA;VENTOLIN HFA) 108 (90 BASE) MCG/ACT inhaler Inhale 2 puffs into the lungs every 6 (six) hours as needed. For wheezing 10/23/13   Chrystie Nose, MD  amitriptyline (ELAVIL) 10 MG tablet Take 10 mg by mouth at bedtime.  08/13/13   Historical Provider, MD  furosemide (LASIX) 40 MG tablet Take 1 tablet (40 mg total) by mouth daily. 10/11/13   Mihai Croitoru, MD  hydrALAZINE (APRESOLINE) 25 MG tablet Take 2 tablets (50 mg total) by mouth 3 (three) times daily. 10/25/13   Mihai Croitoru, MD  insulin glargine (LANTUS) 100 UNIT/ML injection Inject 0.35  mLs (35 Units total) into the skin 2 (two) times daily. Take 30 units in the morning or 35 units in the evening 10/15/13   Vassie Lollarlos Madera, MD  isosorbide dinitrate (ISORDIL) 10 MG tablet Take 2 tablets (20 mg total) by mouth 3 (three) times daily. 10/25/13   Mihai Croitoru, MD  metoprolol tartrate (LOPRESSOR) 25 MG tablet Take 25 mg by mouth 2 (two) times daily.    Historical Provider, MD  naproxen sodium (ANAPROX) 220 MG tablet Take 440 mg by mouth daily as needed. For pain    Historical Provider, MD  sitaGLIPtin  (JANUVIA) 50 MG tablet Take 0.5 tablets (25 mg total) by mouth daily. 10/15/13   Vassie Lollarlos Madera, MD  spironolactone (ALDACTONE) 25 MG tablet Take 0.5 tablets (12.5 mg total) by mouth daily. 10/15/13   Vassie Lollarlos Madera, MD    Physical Exam: Filed Vitals:   12/09/13 2035 12/09/13 2246 12/09/13 2249 12/09/13 2254  BP: 110/78   120/57  Pulse: 80   86  Temp: 99.1 F (37.3 C)   98 F (36.7 C)  TempSrc:    Oral  Resp: 16     Height:  5\' 3"  (1.6 m)    Weight:   67.132 kg (148 lb)   SpO2: 98%   100%    General: Alert, Awake and Oriented to Time, Place and Person. Appear in mild distress Eyes: PERRL ENT: Oral Mucosa clear moist. Neck: no JVD Cardiovascular: S1 and S2 Present, no Murmur, Peripheral Pulses Present Respiratory: Bilateral Air entry equal and Decreased,Clear to Auscultation,  no Crackles,no wheezes Abdomen: Bowel Sound Present, Soft and Non tender Skin: no Rash Extremities: no Pedal edema, no calf tenderness Neurologic: Grossly Unremarkable.  Labs on Admission:  CBC:  Recent Labs Lab 12/09/13 1630  WBC 18.2*  NEUTROABS 16.4*  HGB 12.2  HCT 36.6  MCV 79.7  PLT 281    CMP     Component Value Date/Time   NA 140 12/09/2013 1630   K 4.8 12/09/2013 1630   CL 102 12/09/2013 1630   CO2 25 12/09/2013 1630   GLUCOSE 151* 12/09/2013 1630   BUN 43* 12/09/2013 1630   CREATININE 1.90* 12/09/2013 1630   CREATININE 1.80* 10/11/2013 1108   CALCIUM 8.9 12/09/2013 1630   PROT 8.6* 10/12/2013 1230   ALBUMIN 3.6 10/12/2013 1230   AST 16 10/12/2013 1230   ALT 17 10/12/2013 1230   ALKPHOS 148* 10/12/2013 1230   BILITOT 0.6 10/12/2013 1230   GFRNONAA 25* 12/09/2013 1630   GFRAA 29* 12/09/2013 1630     Recent Labs Lab 12/09/13 1630  LIPASE 42   No results found for this basename: AMMONIA,  in the last 168 hours  No results found for this basename: CKTOTAL, CKMB, CKMBINDEX, TROPONINI,  in the last 168 hours BNP (last 3 results)  Recent Labs  09/12/13 1035  PROBNP 4807.0*     Radiological Exams on Admission: Dg Chest 2 View  12/09/2013   CLINICAL DATA:  Emesis  EXAM: CHEST  2 VIEW  COMPARISON:  DG CHEST 1V PORT dated 10/12/2013  FINDINGS: Left-sided pacer with leads overlying normal cardiac silhouette. There is no pulmonary edema or pleural fluid. No pneumothorax. There is mild central venous pulmonary congestion.  IMPRESSION: Mild central venous pulmonary congestion. No pulmonary edema or infiltrate.   Electronically Signed   By: Genevive BiStewart  Edmunds M.D.   On: 12/09/2013 17:01    Assessment/Plan Active Problems:   Hypertension   Diabetes mellitus   Cardiomyopathy, (? ischemic AS AK  on echo), EF 25%   CKD (chronic kidney disease) stage 3, GFR 30-59 ml/min   UTI (lower urinary tract infection)   1. UTI (lower urinary tract infection) The patient is coming in with complaints of vomiting hematuria. A urinalysis shows presence of UTI. She is hemodynamically stable but has mild acute kidney injury. At present she will be admitted for further workup. I would continue treating her with IV ceftriaxone. I would hold her Lasix for one dose overnight.  2. Acute on chronic kidney injury Etiology currently unclear but may be combination of urinary tract infection leading to dehydration, chronic use of NSAIDs. I will obtain ultrasound of her kidney for further workup and rule out renal stone.  3. Nonischemic cardiomyopathy status post ICD implantation Patient appears hemodynamically stable and does not have any acute cardiac complains Monitor on telemetry  4. Diabetes mellitus Continue insulin Lantus and place on sliding scale  DVT Prophylaxis: subcutaneous Heparin Nutrition: Cardiac and diabetic diet  Code Status: Full  Family Communication: daughter was present at bedside, opportunity was given to ask question and all questions were answered satisfactorily at the time of interview. Disposition: Admitted to inpatient in telemetry unit.  Author: Lynden Oxford,  MD [facility] Pager: [phone] 12/09/2013, 11:57 PM    If 7PM-7AM, please contact night-coverage [email or web address] Password [email or web address]

## 2013-12-09 NOTE — ED Provider Notes (Signed)
CSN: [id number]     Arrival date & time 12/09/13  1426 History   First MD Initiated Contact with Patient 12/09/13 1820    This chart was scribed for Hilario Quarryanielle S Terius Jacuinde, MD by Ladona Ridgelaylor Day, ED scribe. This patient was seen in room MH01/MH01 and the patient's care was started at 1820.  Chief Complaint  Patient presents with  . Emesis   The history is provided by the patient. No language interpreter was used.   HPI Comments: [name] PalShirley [name] is a 73 y.o. female who presents to the Emergency Department complaining of emesis episodes, chills and generalized weakness onset today, 6 hours ago. She lives at home w/her daughter who states that she was fine yesterday but today she had not eaten breakfast and when her daughter returned w/some food pt began to have emesis episodes, consisting of stomach contents. She has eaten half a cheeseburger since this time. She is normally able to ambulate w/cane/walker but today having much more difficulty w/generalized weakness. She denies abdominal pain, dysuria, diarrhea. She is SOB w/exertion at baseline, not increased today. No O2 at home. No sick contacts. Unsure of fever. She has a baseline cough.  Hx of DM, pacemaker and stage 4 kidney disease.  She is not a smoker Her PCP is Dr. Michiel CowboyVelaquez Cardiologist Dr. Royann Shiversroitoru at [facility].  Past Medical History  Diagnosis Date  . Hypertension   . CHF (congestive heart failure)   . Hyperlipidemia   . Nonischemic cardiomyopathy     Hattie Perch/notes 11/20/2012  . ICD (implantable cardiac defibrillator) in place 2006  . Myocardial infarction 2006  . Pacemaker 2006  . Cardiac arrest 2006     etiology unknown, s/p dual chamber pacemaker & not AICD for ? 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 Complete heart block   . Shortness of breath     "laying down" (11/20/2012)  . Type II diabetes mellitus   . Arthritis     "fingers" (11/20/2012)  . Depression     "lost alot of family this year" (11/20/2012)  . Chronic renal insufficiency, stage III (moderate)    Etiology unknown  . CKD (chronic kidney disease) stage 3, GFR 30-59 ml/min 11/20/2012   Past Surgical History  Procedure Laterality Date  . Tubal ligation  1970's  . Insert / replace / remove pacemaker  2006    Dual chamber pacemaker, for secondary prevention cardiac arrest  . Cardiac defibrillator placement  2006    [employer]   Family History  Problem Relation Age of Onset  . Heart disease Brother   . Diabetes Mother   . Diabetes Father    History  Substance Use Topics  . Smoking status: Former Smoker -- 2.00 packs/day for 45 years    Types: Cigarettes  . Smokeless tobacco: Never Used     Comment: 11/20/2012 "quit smoking in the 1990's"  . Alcohol Use: No     Comment: 11/20/2012 "used to drink quite a bit; quit drinking ~ 1990's"    OB History   Grav Para Term Preterm Abortions TAB SAB Ect Mult Living                 Review of Systems  Constitutional: Positive for chills. Negative for fever.  HENT: Negative for congestion and rhinorrhea.   Respiratory: Negative for cough and shortness of breath.   Cardiovascular: Negative for chest pain.  Gastrointestinal: Positive for nausea and vomiting. Negative for abdominal pain and diarrhea.  Musculoskeletal: Negative for back pain.  Skin: Negative for color  change and rash.  Neurological: Negative for syncope.  All other systems reviewed and are negative.   A complete 10 system review of systems was obtained and all systems are negative except as noted in the HPI and PMH.   Allergies  Codeine and Statins  Home Medications   Current Outpatient Rx  Name  Route  Sig  Dispense  Refill  . albuterol (PROVENTIL HFA;VENTOLIN HFA) 108 (90 BASE) MCG/ACT inhaler   Inhalation   Inhale 2 puffs into the lungs every 6 (six) hours as needed. For wheezing   1 Inhaler   6   . amitriptyline (ELAVIL) 10 MG tablet   Oral   Take 10 mg by mouth at bedtime.          . furosemide (LASIX) 40 MG tablet   Oral   Take 1 tablet (40 mg  total) by mouth daily.   30 tablet   0   . hydrALAZINE (APRESOLINE) 25 MG tablet   Oral   Take 2 tablets (50 mg total) by mouth 3 (three) times daily.   180 tablet   6   . insulin glargine (LANTUS) 100 UNIT/ML injection   Subcutaneous   Inject 0.35 mLs (35 Units total) into the skin 2 (two) times daily. Take 30 units in the morning or 35 units in the evening   10 mL   12   . isosorbide dinitrate (ISORDIL) 10 MG tablet   Oral   Take 2 tablets (20 mg total) by mouth 3 (three) times daily.   180 tablet   6   . metoprolol tartrate (LOPRESSOR) 25 MG tablet   Oral   Take 25 mg by mouth 2 (two) times daily.         . naproxen sodium (ANAPROX) 220 MG tablet   Oral   Take 440 mg by mouth daily as needed. For pain         . sitaGLIPtin (JANUVIA) 50 MG tablet   Oral   Take 0.5 tablets (25 mg total) by mouth daily.         Marland Kitchen spironolactone (ALDACTONE) 25 MG tablet   Oral   Take 0.5 tablets (12.5 mg total) by mouth daily.          Triage Vitals: BP 152/71  Pulse 102  Temp(Src) 100.1 F (37.8 C) (Oral)  Resp 14  SpO2 94% Physical Exam  Nursing note and vitals reviewed. Constitutional: She is oriented to person, place, and time. She appears well-developed and well-nourished. No distress.  HENT:  Head: Normocephalic and atraumatic.  Eyes: Conjunctivae are normal. Right eye exhibits no discharge. Left eye exhibits no discharge.  Neck: Normal range of motion.  Cardiovascular: Normal rate.   Pulmonary/Chest: Effort normal. No respiratory distress.  Musculoskeletal: Normal range of motion. She exhibits no edema.  No CVA tenderness  Neurological: She is alert and oriented to person, place, and time.  Skin: Skin is warm and dry.  Psychiatric: She has a normal mood and affect. Thought content normal.   ED Course  Procedures (including critical care time) DIAGNOSTIC STUDIES: Oxygen Saturation is 87% on room air, low by my interpretation.    COORDINATION OF CARE: At  625 PM Discussed treatment plan with patient which includes blood work, CXR, IV fluids, UA. Patient agrees.   Labs Review Labs Reviewed  CBC WITH DIFFERENTIAL - Abnormal; Notable for the following:    WBC 18.2 (*)    RDW 17.4 (*)    Neutrophils Relative % 90 (*)  Neutro Abs 16.4 (*)    Lymphocytes Relative 4 (*)    All other components within normal limits  BASIC METABOLIC PANEL - Abnormal; Notable for the following:    Glucose, Bld 151 (*)    BUN 43 (*)    Creatinine, Ser 1.90 (*)    GFR calc non Af Amer 25 (*)    GFR calc Af Amer 29 (*)    All other components within normal limits  LIPASE, BLOOD  URINALYSIS, ROUTINE W REFLEX MICROSCOPIC  Last creatinine 1.74- November Imaging Review Dg Chest 2 View  12/09/2013   CLINICAL DATA:  Emesis  EXAM: CHEST  2 VIEW  COMPARISON:  DG CHEST 1V PORT dated 10/12/2013  FINDINGS: Left-sided pacer with leads overlying normal cardiac silhouette. There is no pulmonary edema or pleural fluid. No pneumothorax. There is mild central venous pulmonary congestion.  IMPRESSION: Mild central venous pulmonary congestion. No pulmonary edema or infiltrate.   Electronically Signed   By: Genevive Bi M.D.   On: 12/09/2013 17:01   EKG Interpretation    Date/Time:  Sunday December 09 2013 20:12:37 EST Ventricular Rate:  89 PR Interval:  168 QRS Duration: 202 QT Interval:  498 QTC Calculation: 605 R Axis:   -66 Text Interpretation:  Atrial-sensed ventricular-paced rhythm with occasional Premature ventricular complexes Abnormal ECG Confirmed by Zafiro Routson MD, Reyli Schroth ([id number]) on 12/09/2013 8:19:15 PM           Filed Vitals:   12/09/13 1425  BP: 152/71  Pulse: 102  Temp: 100.1 F (37.8 C)  Resp: 14    MDM  73 y.o. female with multiple health problems who presents today with chills, fever, generalized weakness, and urinary tract infection. She is having her urine cultured and is received Rocephin 1 g IV. Patient with acute on chronic renal failure.  IV  fluids infusing.      Hilario Quarry, MD 12/09/13 2019

## 2013-12-10 ENCOUNTER — Inpatient Hospital Stay (HOSPITAL_COMMUNITY): Payer: Medicare Other

## 2013-12-10 LAB — COMPREHENSIVE METABOLIC PANEL
ALK PHOS: 73 U/L (ref 39–117)
ALT: 15 U/L (ref 0–35)
AST: 21 U/L (ref 0–37)
Albumin: 2.6 g/dL — ABNORMAL LOW (ref 3.5–5.2)
BILIRUBIN TOTAL: 0.5 mg/dL (ref 0.3–1.2)
BUN: 40 mg/dL — ABNORMAL HIGH (ref 6–23)
CO2: 24 meq/L (ref 19–32)
Calcium: 8.6 mg/dL (ref 8.4–10.5)
Chloride: 102 mEq/L (ref 96–112)
Creatinine, Ser: 1.98 mg/dL — ABNORMAL HIGH (ref 0.50–1.10)
GFR calc Af Amer: 27 mL/min — ABNORMAL LOW (ref >90)
GFR, EST NON AFRICAN AMERICAN: 24 mL/min — AB (ref >90)
GLUCOSE: 118 mg/dL — AB (ref 70–99)
POTASSIUM: 4.4 meq/L (ref 3.7–5.3)
SODIUM: 138 meq/L (ref 137–147)
Total Protein: 6.6 g/dL (ref 6.0–8.3)

## 2013-12-10 LAB — CBC
HCT: 32.9 % — ABNORMAL LOW (ref 36.0–46.0)
HEMOGLOBIN: 10.8 g/dL — AB (ref 12.0–15.0)
MCH: 26.6 pg (ref 26.0–34.0)
MCHC: 32.8 g/dL (ref 30.0–36.0)
MCV: 81 fL (ref 78.0–100.0)
PLATELETS: 263 10*3/uL (ref 150–400)
RBC: 4.06 MIL/uL (ref 3.87–5.11)
RDW: 17.2 % — AB (ref 11.5–15.5)
WBC: 12.6 10*3/uL — ABNORMAL HIGH (ref 4.0–10.5)

## 2013-12-10 LAB — GLUCOSE, CAPILLARY
GLUCOSE-CAPILLARY: 144 mg/dL — AB (ref 70–99)
Glucose-Capillary: 108 mg/dL — ABNORMAL HIGH (ref 70–99)
Glucose-Capillary: 137 mg/dL — ABNORMAL HIGH (ref 70–99)
Glucose-Capillary: 162 mg/dL — ABNORMAL HIGH (ref 70–99)
Glucose-Capillary: 118 mg/dL — ABNORMAL HIGH (ref 70–99)

## 2013-12-10 LAB — PROTIME-INR
INR: 1.17 (ref 0.00–1.49)
PROTHROMBIN TIME: 14.7 s (ref 11.6–15.2)

## 2013-12-10 MED ORDER — INSULIN ASPART 100 UNIT/ML ~~LOC~~ SOLN: 0.0000 [IU] | Freq: Every day | SUBCUTANEOUS | Status: DC

## 2013-12-10 MED ORDER — INSULIN ASPART 100 UNIT/ML ~~LOC~~ SOLN
0.0000 [IU] | Freq: Three times a day (TID) | SUBCUTANEOUS | Status: DC
  Administered 2013-12-10: 3 [IU] via SUBCUTANEOUS
  Administered 2013-12-10: 2 [IU] via SUBCUTANEOUS
  Administered 2013-12-11: 3 [IU] via SUBCUTANEOUS
  Administered 2013-12-11: 2 [IU] via SUBCUTANEOUS
  Administered 2013-12-12: 3 [IU] via SUBCUTANEOUS
  Administered 2013-12-12: 5 [IU] via SUBCUTANEOUS
  Administered 2013-12-12: 3 [IU] via SUBCUTANEOUS
  Administered 2013-12-13: 5 [IU] via SUBCUTANEOUS
  Administered 2013-12-13: 3 [IU] via SUBCUTANEOUS

## 2013-12-10 MED ORDER — ALBUTEROL SULFATE (2.5 MG/3ML) 0.083% IN NEBU: 2.5000 mg | INHALATION_SOLUTION | Freq: Four times a day (QID) | RESPIRATORY_TRACT | Status: DC | PRN

## 2013-12-10 MED ORDER — INSULIN GLARGINE 100 UNIT/ML ~~LOC~~ SOLN
20.0000 [IU] | Freq: Every day | SUBCUTANEOUS | Status: DC
  Administered 2013-12-11 – 2013-12-12 (×2): 20 [IU] via SUBCUTANEOUS
  Filled 2013-12-10 (×3): qty 0.2

## 2013-12-10 MED ORDER — DEXTROSE 5 % IV SOLN
1.0000 g | INTRAVENOUS | Status: DC
  Administered 2013-12-10 – 2013-12-12 (×3): 1 g via INTRAVENOUS
  Filled 2013-12-10 (×4): qty 10

## 2013-12-10 MED ORDER — METOPROLOL TARTRATE 25 MG PO TABS
25.0000 mg | ORAL_TABLET | Freq: Two times a day (BID) | ORAL | Status: DC
  Administered 2013-12-10 – 2013-12-13 (×6): 25 mg via ORAL
  Filled 2013-12-10 (×7): qty 1

## 2013-12-10 NOTE — Care Management Note (Addendum)
    Page 1 of 1   12/13/2013     11:41:27 AM   CARE MANAGEMENT NOTE 12/13/2013  Patient:  [name],[name]   Account Number:  [id number]  Date Initiated:  12/10/2013  Documentation initiated by:  Colleen Can  Subjective/Objective Assessment:   dx UTI, Chronic kidney disease     Action/Plan:   From home   Anticipated DC Date:  12/13/2013   Anticipated DC Plan:  HOME/SELF CARE         Choice offered to / List presented to:             Status of service:  Completed, signed off Medicare Important Message given?   (If response is "NO", the following Medicare IM given date fields will be blank) Date Medicare IM given:   Date Additional Medicare IM given:    Discharge Disposition:  HOME/SELF CARE  Per UR Regulation:  Reviewed for med. necessity/level of care/duration of stay  If discussed at Long Length of Stay Meetings, dates discussed:    Comments:  12/13/13 [name] Zinn RN,BSN NCM [phone] D/C HOME NO NEEDS OR ORDERS.

## 2013-12-10 NOTE — Progress Notes (Signed)
[facility] PROGRESS NOTE  [name] [name] [id number] DOB: 28-Nov-1940 DOA: 12/09/2013 PCP: Doreatha MartinVELAZQUEZ,GRETCHEN, MD  Assessment/Plan: 1. UTI (lower urinary tract infection)  The patient is coming in with complaints of  hematuria. A urinalysis shows presence of UTI.  Continue with  IV ceftriaxone.  CT scan show Thickened bladder wall. This could be seen with cystitis and/or bladder malignancy. Air is noted within the bladder. This could be  secondary to instrumentation, infection, or fistula. Question has the patient had recent catheterization. Cystoscopic evaluation may  prove useful. Urology consulted.   2. Acute on chronic kidney injury  Etiology currently unclear but may be combination of urinary tract infection leading to dehydration, chronic use of NSAIDs.  Strict I and O.   3. Nonischemic cardiomyopathy status post ICD implantation  Patient appears hemodynamically stable and does not have any acute cardiac complains  Monitor on telemetry.  Hold lasix today due to renal failure.   4. Diabetes mellitus  Change Lantus to 20 mg daily from 30 BID due to CBG in the 130 ranges and worsening renal function to avoid hypoglycemia.   DVT Prophylaxis: subcutaneous Heparin     Code Status: Full Code.  Family Communication: Disposition Plan: remain inpatient.    Consultants:  none  Procedures:  none  Antibiotics:  Ceftriaxone 1-18  HPI/Subjective: Feeling better than yesterday. No dyspnea , no chest pain.   Objective: Filed Vitals:   12/10/13 0555  BP: 118/61  Pulse: 82  Temp: 98.1 F (36.7 C)  Resp: 16   No intake or output data in the 24 hours ending 12/10/13 0702 Filed Weights   12/09/13 2249 12/10/13 0555  Weight: 67.132 kg (148 lb) 67.5 kg (148 lb 13 oz)    Exam:   General:  No acute distress.   Cardiovascular: S 1, S 2 RRR  Respiratory: CTA  Abdomen: BS present, soft, NT  Musculoskeletal: no edema.   Data Reviewed: Basic Metabolic  Panel:  Recent Labs Lab 12/09/13 1630 12/10/13 0514  NA 140 138  K 4.8 4.4  CL 102 102  CO2 25 24  GLUCOSE 151* 118*  BUN 43* 40*  CREATININE 1.90* 1.98*  CALCIUM 8.9 8.6   Liver Function Tests:  Recent Labs Lab 12/10/13 0514  AST 21  ALT 15  ALKPHOS 73  BILITOT 0.5  PROT 6.6  ALBUMIN 2.6*    Recent Labs Lab 12/09/13 1630  LIPASE 42   No results found for this basename: AMMONIA,  in the last 168 hours CBC:  Recent Labs Lab 12/09/13 1630 12/10/13 0514  WBC 18.2* 12.6*  NEUTROABS 16.4*  --   HGB 12.2 10.8*  HCT 36.6 32.9*  MCV 79.7 81.0  PLT 281 263   Cardiac Enzymes: No results found for this basename: CKTOTAL, CKMB, CKMBINDEX, TROPONINI,  in the last 168 hours BNP (last 3 results)  Recent Labs  09/12/13 1035  PROBNP 4807.0*   CBG:  Recent Labs Lab 12/09/13 2315  GLUCAP 144*    No results found for this or any previous visit (from the past 240 hour(s)).   Studies: Dg Chest 2 View  12/09/2013   CLINICAL DATA:  Emesis  EXAM: CHEST  2 VIEW  COMPARISON:  DG CHEST 1V PORT dated 10/12/2013  FINDINGS: Left-sided pacer with leads overlying normal cardiac silhouette. There is no pulmonary edema or pleural fluid. No pneumothorax. There is mild central venous pulmonary congestion.  IMPRESSION: Mild central venous pulmonary congestion. No pulmonary edema or infiltrate.   Electronically Signed  By: Genevive Bi M.D.   On: 12/09/2013 17:01    Scheduled Meds: . amitriptyline  10 mg Oral QHS  . cefTRIAXone (ROCEPHIN)  IV  1 g Intravenous Q24H  . heparin  5,000 Units Subcutaneous Q8H  . hydrALAZINE  50 mg Oral TID  . insulin aspart  0-15 Units Subcutaneous TID WC  . insulin aspart  0-5 Units Subcutaneous QHS  . insulin glargine  30 Units Subcutaneous BID  . isosorbide dinitrate  20 mg Oral TID  . metoprolol tartrate  25 mg Oral BID  . pantoprazole  40 mg Oral Daily  . sodium chloride  3 mL Intravenous Q12H  . spironolactone  12.5 mg Oral Daily    Continuous Infusions:   Principal Problem:   UTI (lower urinary tract infection) Active Problems:   Hypertension   Diabetes mellitus   Cardiomyopathy, (? ischemic AS AK on echo), EF 25%   CKD (chronic kidney disease) stage 3, GFR 30-59 ml/min    Time spent: 75 minutes.     Jeff Frieden  [facility] Pager [phone]. If 7PM-7AM, please contact night-coverage at [email or web address], password [facility] 12/10/2013, 7:02 AM  LOS: 1 day

## 2013-12-10 NOTE — Consult Note (Signed)
Consult: cystitis, pneumaturia  History of Present Illness: 73 y.o. female with Past medical history of hypertension, nonischemic cardiomyopathy status post ICD implantation, CHF, dyslipidemia, history of diabetes admitted with fever, dysuria x 3-4 days. UA showed showed rbc's, wbc's, many bacteria. Cx pending. CT showed no hydro or stones, air in bladder dome, thickened bladder wall. Tm 100 here now AF. Cr 1.98 which is close to her baseline.     Currently, she feels better. She did note pneumaturia prior to admission and today. She has never had this before and denies diverticulitis. She has a weak stream, but usually voids well after he lasix (which has been held). Her dysuria has improved. She had dark urine which is clearing.   She denies prior GU hx or Pelvic surgery.   Past Medical History  Diagnosis Date  . Hypertension   . CHF (congestive heart failure)   . Hyperlipidemia   . Nonischemic cardiomyopathy     Hattie Perch/notes 11/20/2012  . ICD (implantable cardiac defibrillator) in place 2006  . Myocardial infarction 2006  . Pacemaker 2006  . Cardiac arrest 2006     etiology unknown, s/p dual chamber pacemaker & not AICD for ? 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 Complete heart block   . Shortness of breath     "laying down" (11/20/2012)  . Type II diabetes mellitus   . Arthritis     "fingers" (11/20/2012)  . Depression     "lost alot of family this year" (11/20/2012)  . Chronic renal insufficiency, stage III (moderate)     Etiology unknown  . CKD (chronic kidney disease) stage 3, GFR 30-59 ml/min 11/20/2012   Past Surgical History  Procedure Laterality Date  . Tubal ligation  1970's  . Insert / replace / remove pacemaker  2006    Dual chamber pacemaker, for secondary prevention cardiac arrest  . Cardiac defibrillator placement  2006    [employer]    Home Medications:  Prescriptions prior to admission  Medication Sig Dispense Refill  . albuterol (PROVENTIL HFA;VENTOLIN HFA) 108 (90 BASE) MCG/ACT  inhaler Inhale 2 puffs into the lungs every 6 (six) hours as needed. For wheezing  1 Inhaler  6  . amitriptyline (ELAVIL) 10 MG tablet Take 10 mg by mouth at bedtime.       . furosemide (LASIX) 40 MG tablet Take 1 tablet (40 mg total) by mouth daily.  30 tablet  0  . hydrALAZINE (APRESOLINE) 25 MG tablet Take 2 tablets (50 mg total) by mouth 3 (three) times daily.  180 tablet  6  . insulin glargine (LANTUS) 100 UNIT/ML injection Inject 0.35 mLs (35 Units total) into the skin 2 (two) times daily. Take 30 units in the morning or 35 units in the evening  10 mL  12  . isosorbide dinitrate (ISORDIL) 10 MG tablet Take 2 tablets (20 mg total) by mouth 3 (three) times daily.  180 tablet  6  . metoprolol tartrate (LOPRESSOR) 25 MG tablet Take 25 mg by mouth 2 (two) times daily.      . naproxen sodium (ANAPROX) 220 MG tablet Take 440 mg by mouth daily as needed. For pain      . sitaGLIPtin (JANUVIA) 50 MG tablet Take 0.5 tablets (25 mg total) by mouth daily.      Marland Kitchen. spironolactone (ALDACTONE) 25 MG tablet Take 0.5 tablets (12.5 mg total) by mouth daily.       Allergies:  Allergies  Allergen Reactions  . Codeine Nausea Only  . Statins  Diarrhea    Family History  Problem Relation Age of Onset  . Heart disease Brother   . Diabetes Mother   . Diabetes Father    Social History:  reports that she has quit smoking. Her smoking use included Cigarettes. She has a 90 pack-year smoking history. She has never used smokeless tobacco. She reports that she does not drink alcohol or use illicit drugs.  ROS: A complete review of systems was performed.  All systems are negative except for pertinent findings as noted. Review of Systems  All other systems reviewed and are negative.     Physical Exam:  Vital signs in last 24 hours: Temp:  [98 F (36.7 C)-100.1 F (37.8 C)] 98.1 F (36.7 C) (01/19 0555) Pulse Rate:  [80-102] 82 (01/19 0555) Resp:  [14-16] 16 (01/19 0555) BP: (110-152)/(57-78) 118/61 mmHg  (01/19 0555) SpO2:  [87 %-100 %] 99 % (01/19 0555) Weight:  [67.132 kg (148 lb)-67.5 kg (148 lb 13 oz)] 67.5 kg (148 lb 13 oz) (01/19 0555) General:  Alert and oriented, No acute distress HEENT: Normocephalic, atraumatic Neck: No JVD or lymphadenopathy Cardiovascular: Regular rate and rhythm Lungs: Regular rate and effort Abdomen: Soft, nontender, nondistended, no abdominal masses Back: No CVA tenderness Extremities: No edema Neurologic: Grossly intact Chaperone: TARA  Laboratory Data:  Results for orders placed during the hospital encounter of 12/09/13 (from the past 24 hour(s))  CBC WITH DIFFERENTIAL     Status: Abnormal   Collection Time    12/09/13  4:30 PM      Result Value Range   WBC 18.2 (*) 4.0 - 10.5 K/uL   RBC 4.59  3.87 - 5.11 MIL/uL   Hemoglobin 12.2  12.0 - 15.0 g/dL   HCT 96.0  45.4 - 09.8 %   MCV 79.7  78.0 - 100.0 fL   MCH 26.6  26.0 - 34.0 pg   MCHC 33.3  30.0 - 36.0 g/dL   RDW 11.9 (*) 14.7 - 82.9 %   Platelets 281  150 - 400 K/uL   Neutrophils Relative % 90 (*) 43 - 77 %   Neutro Abs 16.4 (*) 1.7 - 7.7 K/uL   Lymphocytes Relative 4 (*) 12 - 46 %   Lymphs Abs 0.7  0.7 - 4.0 K/uL   Monocytes Relative 6  3 - 12 %   Monocytes Absolute 1.0  0.1 - 1.0 K/uL   Eosinophils Relative 1  0 - 5 %   Eosinophils Absolute 0.1  0.0 - 0.7 K/uL   Basophils Relative 0  0 - 1 %   Basophils Absolute 0.0  0.0 - 0.1 K/uL  BASIC METABOLIC PANEL     Status: Abnormal   Collection Time    12/09/13  4:30 PM      Result Value Range   Sodium 140  137 - 147 mEq/L   Potassium 4.8  3.7 - 5.3 mEq/L   Chloride 102  96 - 112 mEq/L   CO2 25  19 - 32 mEq/L   Glucose, Bld 151 (*) 70 - 99 mg/dL   BUN 43 (*) 6 - 23 mg/dL   Creatinine, Ser 5.62 (*) 0.50 - 1.10 mg/dL   Calcium 8.9  8.4 - 13.0 mg/dL   GFR calc non Af Amer 25 (*) >90 mL/min   GFR calc Af Amer 29 (*) >90 mL/min  LIPASE, BLOOD     Status: None   Collection Time    12/09/13  4:30 PM  Result Value Range   Lipase 42  11  - 59 U/L  URINALYSIS, ROUTINE W REFLEX MICROSCOPIC     Status: Abnormal   Collection Time    12/09/13  6:28 PM      Result Value Range   Color, Urine YELLOW  YELLOW   APPearance CLOUDY (*) CLEAR   Specific Gravity, Urine 1.017  1.005 - 1.030   pH 6.0  5.0 - 8.0   Glucose, UA NEGATIVE  NEGATIVE mg/dL   Hgb urine dipstick LARGE (*) NEGATIVE   Bilirubin Urine NEGATIVE  NEGATIVE   Ketones, ur NEGATIVE  NEGATIVE mg/dL   Protein, ur >211 (*) NEGATIVE mg/dL   Urobilinogen, UA 0.2  0.0 - 1.0 mg/dL   Nitrite NEGATIVE  NEGATIVE   Leukocytes, UA MODERATE (*) NEGATIVE  URINE MICROSCOPIC-ADD ON     Status: Abnormal   Collection Time    12/09/13  6:28 PM      Result Value Range   WBC, UA TOO NUMEROUS TO COUNT  <3 WBC/hpf   RBC / HPF TOO NUMEROUS TO COUNT  <3 RBC/hpf   Bacteria, UA MANY (*) RARE   Urine-Other URINALYSIS PERFORMED ON SUPERNATANT    CG4 I-STAT (LACTIC ACID)     Status: None   Collection Time    12/09/13  8:25 PM      Result Value Range   Lactic Acid, Venous 1.08  0.5 - 2.2 mmol/L  GLUCOSE, CAPILLARY     Status: Abnormal   Collection Time    12/09/13 11:15 PM      Result Value Range   Glucose-Capillary 144 (*) 70 - 99 mg/dL   Comment 1 Documented in Chart     Comment 2 Notify RN    COMPREHENSIVE METABOLIC PANEL     Status: Abnormal   Collection Time    12/10/13  5:14 AM      Result Value Range   Sodium 138  137 - 147 mEq/L   Potassium 4.4  3.7 - 5.3 mEq/L   Chloride 102  96 - 112 mEq/L   CO2 24  19 - 32 mEq/L   Glucose, Bld 118 (*) 70 - 99 mg/dL   BUN 40 (*) 6 - 23 mg/dL   Creatinine, Ser 9.41 (*) 0.50 - 1.10 mg/dL   Calcium 8.6  8.4 - 74.0 mg/dL   Total Protein 6.6  6.0 - 8.3 g/dL   Albumin 2.6 (*) 3.5 - 5.2 g/dL   AST 21  0 - 37 U/L   ALT 15  0 - 35 U/L   Alkaline Phosphatase 73  39 - 117 U/L   Total Bilirubin 0.5  0.3 - 1.2 mg/dL   GFR calc non Af Amer 24 (*) >90 mL/min   GFR calc Af Amer 27 (*) >90 mL/min  CBC     Status: Abnormal   Collection Time     12/10/13  5:14 AM      Result Value Range   WBC 12.6 (*) 4.0 - 10.5 K/uL   RBC 4.06  3.87 - 5.11 MIL/uL   Hemoglobin 10.8 (*) 12.0 - 15.0 g/dL   HCT 81.4 (*) 48.1 - 85.6 %   MCV 81.0  78.0 - 100.0 fL   MCH 26.6  26.0 - 34.0 pg   MCHC 32.8  30.0 - 36.0 g/dL   RDW 31.4 (*) 97.0 - 26.3 %   Platelets 263  150 - 400 K/uL  PROTIME-INR     Status: None   Collection  Time    12/10/13  5:14 AM      Result Value Range   Prothrombin Time 14.7  11.6 - 15.2 seconds   INR 1.17  0.00 - 1.49  GLUCOSE, CAPILLARY     Status: Abnormal   Collection Time    12/10/13  8:07 AM      Result Value Range   Glucose-Capillary 108 (*) 70 - 99 mg/dL  GLUCOSE, CAPILLARY     Status: Abnormal   Collection Time    12/10/13 11:38 AM      Result Value Range   Glucose-Capillary 137 (*) 70 - 99 mg/dL   No results found for this or any previous visit (from the past 240 hour(s)). Creatinine:  Recent Labs  12/09/13 1630 12/10/13 0514  CREATININE 1.90* 1.98*    Impression/Assessment/plan- -pneumaturia, bladder wall thickening  - likely from UTI. Cx pending. Improving on abx. Discussed with patient nature, R/B of cystoscopy which will perform as outpatient to evaluate for fistula and bladder wall thickening when she is stable. I gave her my contact info to give her daughter. Will follow.   Antony Haste 12/10/2013

## 2013-12-10 NOTE — Progress Notes (Signed)
ANTIBIOTIC CONSULT NOTE - INITIAL  Pharmacy Consult for Ceftriaxone Indication: UTI  Allergies  Allergen Reactions  . Codeine Nausea Only  . Statins Diarrhea    Patient Measurements: Height: 5\' 3"  (160 cm) Weight: 148 lb (67.132 kg) IBW/kg (Calculated) : 52.4 Adjusted Body Weight:   Vital Signs: Temp: 98 F (36.7 C) (01/18 2254) Temp src: Oral (01/18 2254) BP: 120/57 mmHg (01/18 2254) Pulse Rate: 86 (01/18 2254) Intake/Output from previous day:   Intake/Output from this shift:    Labs:  Recent Labs  12/09/13 1630  WBC 18.2*  HGB 12.2  PLT 281  CREATININE 1.90*   Estimated Creatinine Clearance: 23.9 ml/min (by C-G formula based on Cr of 1.9). No results found for this basename: VANCOTROUGH, VANCOPEAK, VANCORANDOM, GENTTROUGH, GENTPEAK, GENTRANDOM, TOBRATROUGH, TOBRAPEAK, TOBRARND, AMIKACINPEAK, AMIKACINTROU, AMIKACIN,  in the last 72 hours   Microbiology: No results found for this or any previous visit (from the past 720 hour(s)).  Medical History: Past Medical History  Diagnosis Date  . Hypertension   . CHF (congestive heart failure)   . Hyperlipidemia   . Nonischemic cardiomyopathy     Hattie Perch 11/20/2012  . ICD (implantable cardiac defibrillator) in place 2006  . Myocardial infarction 2006  . Pacemaker 2006  . Cardiac arrest 2006     etiology unknown, s/p dual chamber pacemaker & not AICD for ? 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 Complete heart block   . Shortness of breath     "laying down" (11/20/2012)  . Type II diabetes mellitus   . Arthritis     "fingers" (11/20/2012)  . Depression     "lost alot of family this year" (11/20/2012)  . Chronic renal insufficiency, stage III (moderate)     Etiology unknown  . CKD (chronic kidney disease) stage 3, GFR 30-59 ml/min 11/20/2012    Medications:  Anti-infectives   Start     Dose/Rate Route Frequency Ordered Stop   12/10/13 2000  cefTRIAXone (ROCEPHIN) 1 g in dextrose 5 % 50 mL IVPB     1 g 100 mL/hr over 30 Minutes  Intravenous Every 24 hours 12/10/13 0002     12/09/13 1940  cefTRIAXone (ROCEPHIN) 1 G injection    Comments:  Haskins, Kaila   : cabinet override      12/09/13 1940 12/09/13 2018   12/09/13 1845  cefTRIAXone (ROCEPHIN) 1 g in dextrose 5 % 50 mL IVPB     1 g 100 mL/hr over 30 Minutes Intravenous  Once 12/09/13 1832 12/09/13 2054     Assessment: Patient with UTI.  Goal of Therapy:  Ceftriaxone dosed based on patient weight and renal function   Plan:  Ceftriaxone 1gm iv q24hr  Darlina Guys, Romie Keeble Crowford 12/10/2013,1:52 AM

## 2013-12-11 ENCOUNTER — Ambulatory Visit (HOSPITAL_COMMUNITY): Payer: Medicare Other

## 2013-12-11 DIAGNOSIS — I059 Rheumatic mitral valve disease, unspecified: Secondary | ICD-10-CM

## 2013-12-11 LAB — GLUCOSE, CAPILLARY
GLUCOSE-CAPILLARY: 95 mg/dL (ref 70–99)
GLUCOSE-CAPILLARY: 142 mg/dL — AB (ref 70–99)
GLUCOSE-CAPILLARY: 169 mg/dL — AB (ref 70–99)
Glucose-Capillary: 194 mg/dL — ABNORMAL HIGH (ref 70–99)

## 2013-12-11 LAB — OSMOLALITY, URINE: Osmolality, Ur: 476 mOsm/kg (ref 390–1090)

## 2013-12-11 LAB — CBC
HEMATOCRIT: 33.3 % — AB (ref 36.0–46.0)
Hemoglobin: 11 g/dL — ABNORMAL LOW (ref 12.0–15.0)
MCH: 26.6 pg (ref 26.0–34.0)
MCHC: 33 g/dL (ref 30.0–36.0)
MCV: 80.4 fL (ref 78.0–100.0)
PLATELETS: 289 10*3/uL (ref 150–400)
RBC: 4.14 MIL/uL (ref 3.87–5.11)
RDW: 17.3 % — AB (ref 11.5–15.5)
WBC: 7.5 10*3/uL (ref 4.0–10.5)

## 2013-12-11 LAB — BASIC METABOLIC PANEL
BUN: 44 mg/dL — ABNORMAL HIGH (ref 6–23)
CALCIUM: 9.3 mg/dL (ref 8.4–10.5)
CO2: 22 mEq/L (ref 19–32)
CREATININE: 2.13 mg/dL — AB (ref 0.50–1.10)
Chloride: 102 mEq/L (ref 96–112)
GFR calc non Af Amer: 22 mL/min — ABNORMAL LOW (ref >90)
GFR, EST AFRICAN AMERICAN: 25 mL/min — AB (ref >90)
Glucose, Bld: 94 mg/dL (ref 70–99)
Potassium: 4.6 mEq/L (ref 3.7–5.3)
Sodium: 139 mEq/L (ref 137–147)

## 2013-12-11 LAB — OSMOLALITY: Osmolality: 299 mOsm/kg (ref 275–300)

## 2013-12-11 LAB — CREATININE, URINE, RANDOM: CREATININE, URINE: 93.14 mg/dL

## 2013-12-11 LAB — SODIUM, URINE, RANDOM: SODIUM UR: 41 meq/L

## 2013-12-11 MED ORDER — TRAMADOL HCL 50 MG PO TABS
50.0000 mg | ORAL_TABLET | Freq: Once | ORAL | Status: AC
  Administered 2013-12-12: 50 mg via ORAL
  Filled 2013-12-11: qty 1

## 2013-12-11 MED ORDER — ACETAMINOPHEN 325 MG PO TABS: 650.0000 mg | ORAL_TABLET | Freq: Once | ORAL | Status: DC

## 2013-12-11 MED ORDER — VITAMINS A & D EX OINT
TOPICAL_OINTMENT | CUTANEOUS | Status: AC
  Administered 2013-12-11: 15:00:00
  Filled 2013-12-11: qty 5

## 2013-12-11 MED ORDER — SODIUM CHLORIDE 0.9 % IV SOLN
INTRAVENOUS | Status: AC
  Administered 2013-12-11 – 2013-12-12 (×3): via INTRAVENOUS

## 2013-12-11 NOTE — Progress Notes (Signed)
Echocardiogram 2D Echocardiogram has been performed.  [name] [name] 12/11/2013, 4:08 PM

## 2013-12-11 NOTE — Progress Notes (Addendum)
[facility] PROGRESS NOTE  Tamsyn Trissel VKF:[id number] DOB: May 05, 1941 DOA: 12/09/2013 PCP: Doreatha Martin, MD  Assessment/Plan:  1. UTI (lower urinary tract infection)   Image area secondary to UTI, responding well to IV antibiotics which will be continued, follow final urine cultures, Urology consulted once to follow with the patient in the outpatient setting post discharge for outpatient cystoscopy. Hematuria is much improved.    2. Acute on chronic kidney injury  Etiology currently unclear but may be combination of urinary tract infection leading to dehydration, ongoing liberal use of NSAIDs. Stop NSAIDs, obtain urine electrolytes, IV fluids, repeat BMP in the morning     3. 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 Continue on beta blocker.    4. Diabetes mellitus  Change Lantus to 20 mg daily from 30 BID due to CBG in the 130 ranges and worsening renal function to avoid hypoglycemia.   CBG (last 3)   Recent Labs  12/10/13 2110 12/11/13 0705 12/11/13 1124  GLUCAP 118* 95 142*      DVT Prophylaxis: subcutaneous Heparin     Code Status: Full Code.  Family Communication: Disposition Plan: remain inpatient.    Consultants:  Urology  Procedures:  CT Abd-Pelvis  Antibiotics:  Ceftriaxone 1-18  HPI/Subjective: Feeling better than yesterday. No dyspnea , no chest pain.   Objective: Filed Vitals:   12/11/13 0928  BP: 140/77  Pulse: 87  Temp:   Resp:     Intake/Output Summary (Last 24 hours) at 12/11/13 1154 Last data filed at 12/11/13 0940  Gross per 24 hour  Intake    533 ml  Output    665 ml  Net   -132 ml   Filed Weights   12/10/13 0555 12/11/13 0528 12/11/13 0539  Weight: 67.5 kg (148 lb 13 oz) 68.8 kg  (151 lb 10.8 oz) 68.4 kg (150 lb 12.7 oz)    Exam:   General:  No acute distress.   Cardiovascular: S 1, S 2 RRR  Respiratory: CTA  Abdomen: BS present, soft, NT  Musculoskeletal: no edema.   Data Reviewed: Basic Metabolic Panel:  Recent Labs Lab 12/09/13 1630 12/10/13 0514 12/11/13 0540  NA 140 138 139  K 4.8 4.4 4.6  CL 102 102 102  CO2 25 24 22   GLUCOSE 151* 118* 94  BUN 43* 40* 44*  CREATININE 1.90* 1.98* 2.13*  CALCIUM 8.9 8.6 9.3   Liver Function Tests:  Recent Labs Lab 12/10/13 0514  AST 21  ALT 15  ALKPHOS 73  BILITOT 0.5  PROT 6.6  ALBUMIN 2.6*    Recent Labs Lab 12/09/13 1630  LIPASE 42   No results found for this basename: AMMONIA,  in the last 168 hours CBC:  Recent Labs Lab 12/09/13 1630 12/10/13 0514 12/11/13 0540  WBC 18.2* 12.6* 7.5  NEUTROABS 16.4*  --   --   HGB 12.2 10.8* 11.0*  HCT 36.6 32.9* 33.3*  MCV 79.7 81.0 80.4  PLT 281 263 289   Cardiac Enzymes: No results found for this basename: CKTOTAL, CKMB, CKMBINDEX, TROPONINI,  in the last 168 hours BNP (last 3 results)  Recent Labs  09/12/13 1035  PROBNP 4807.0*   CBG:  Recent Labs Lab 12/10/13 1138 12/10/13 1648 12/10/13 2110 12/11/13 0705 12/11/13 1124  GLUCAP 137* 162* 118* 95 142*  Recent Results (from the past 240 hour(s))  URINE CULTURE     Status: None   Collection Time    12/09/13  6:28 PM      Result Value Range Status   Specimen Description URINE, CLEAN CATCH   Final   Special Requests NONE   Final   Culture  Setup Time     Final   Value: 12/10/2013 07:52     Performed at [facility] Count     Final   Value: >=100,000 COLONIES/ML     Performed at [facility]   Culture     Final   Value: ESCHERICHIA COLI     Performed at [facility]   Report Status PENDING   Incomplete     Studies: Ct Abdomen Pelvis Wo Contrast  12/10/2013   CLINICAL DATA:  Hematuria.  EXAM: CT ABDOMEN AND PELVIS WITHOUT  CONTRAST  TECHNIQUE: Multidetector CT imaging of the abdomen and pelvis was performed following the standard protocol without intravenous contrast.  COMPARISON:  None.  FINDINGS: Liver normal. Spleen normal. Pancreas normal. Gallstones. No gallbladder wall thickening or pericholecystic fluid collections. No biliary distention.  Adrenals normal. A 5 mm calcific density noted in the right renal hilum. This appears to represent renal vascular calcification. Innumerable pelvic phleboliths. Tiny nonobstructing distal ureteral stones difficult to completely exclude due to the multiple phleboliths present. Bladder wall is thickened measuring 13 mm. Cystitis and/or bladder malignancy could present in this fashion. There is air noted in the bladder. This could be from instrumentation, infection, or fistula. Question has the patient had a recent bladder catheterization. Uterus and adnexa are unremarkable. No free pelvic fluid.  No significant adenopathy. Aorta normal caliber. Atherosclerotic vascular calcification.  Appendix normal. No inflammatory changes node in the right or left lower quadrant. No bowel distention. Small hiatal hernia.  Abdominal wall is intact. Degenerative changes thoracolumbar spine and both hips. Mild atelectasis lung bases. Cardiomegaly. Cardiac pacer noted.  IMPRESSION: 1. Thickened bladder wall. This could be seen with cystitis and/or bladder malignancy. Air is noted within the bladder. This could be secondary to instrumentation, infection, or fistula. Question has the patient had recent catheterization. Cystoscopic evaluation may prove useful. 2. Calcification noted project to the right renal hilum. This appears to be in the right renal artery. No evidence of obstructing ureteral stone.   Electronically Signed   By: Maisie Fus  Register   On: 12/10/2013 08:04   Dg Chest 2 View  12/09/2013   CLINICAL DATA:  Emesis  EXAM: CHEST  2 VIEW  COMPARISON:  DG CHEST 1V PORT dated 10/12/2013  FINDINGS:  Left-sided pacer with leads overlying normal cardiac silhouette. There is no pulmonary edema or pleural fluid. No pneumothorax. There is mild central venous pulmonary congestion.  IMPRESSION: Mild central venous pulmonary congestion. No pulmonary edema or infiltrate.   Electronically Signed   By: Genevive Bi M.D.   On: 12/09/2013 17:01    Scheduled Meds: . amitriptyline  10 mg Oral QHS  . cefTRIAXone (ROCEPHIN)  IV  1 g Intravenous Q24H  . insulin aspart  0-15 Units Subcutaneous TID WC  . insulin aspart  0-5 Units Subcutaneous QHS  . insulin glargine  20 Units Subcutaneous QHS  . metoprolol tartrate  25 mg Oral BID  . pantoprazole  40 mg Oral Daily  . sodium chloride  3 mL Intravenous Q12H   Continuous Infusions: . sodium chloride 100 mL/hr at 12/11/13 1006    Principal Problem:  UTI (lower urinary tract infection) Active Problems:   Hypertension   Diabetes mellitus   Cardiomyopathy, (? ischemic AS AK on echo), EF 25%   CKD (chronic kidney disease) stage 3, GFR 30-59 ml/min    Time spent: 35 minutes.     Leroy SeaSINGH,Alyce Inscore K  [facility] Pager [phone]. If 7PM-7AM, please contact night-coverage at [email or web address], password [email or web address] 12/11/2013, 11:54 AM  LOS: 2 days

## 2013-12-11 NOTE — Progress Notes (Signed)
Patient ambulated in hallway with rolling walker. Patient tolerated well and ambulated >200 feet. Will continue to monitor patient. Setzer, Don Broach

## 2013-12-12 DIAGNOSIS — I5023 Acute on chronic systolic (congestive) heart failure: Secondary | ICD-10-CM

## 2013-12-12 DIAGNOSIS — I509 Heart failure, unspecified: Secondary | ICD-10-CM

## 2013-12-12 DIAGNOSIS — E785 Hyperlipidemia, unspecified: Secondary | ICD-10-CM

## 2013-12-12 LAB — BASIC METABOLIC PANEL
BUN: 40 mg/dL — ABNORMAL HIGH (ref 6–23)
CALCIUM: 8.9 mg/dL (ref 8.4–10.5)
CO2: 21 mEq/L (ref 19–32)
Chloride: 100 mEq/L (ref 96–112)
Creatinine, Ser: 1.75 mg/dL — ABNORMAL HIGH (ref 0.50–1.10)
GFR calc Af Amer: 32 mL/min — ABNORMAL LOW (ref >90)
GFR, EST NON AFRICAN AMERICAN: 28 mL/min — AB (ref >90)
GLUCOSE: 214 mg/dL — AB (ref 70–99)
Potassium: 5.2 mEq/L (ref 3.7–5.3)
SODIUM: 135 meq/L — AB (ref 137–147)

## 2013-12-12 LAB — GLUCOSE, CAPILLARY
GLUCOSE-CAPILLARY: 223 mg/dL — AB (ref 70–99)
Glucose-Capillary: 201 mg/dL — ABNORMAL HIGH (ref 70–99)
Glucose-Capillary: 181 mg/dL — ABNORMAL HIGH (ref 70–99)
Glucose-Capillary: 199 mg/dL — ABNORMAL HIGH (ref 70–99)

## 2013-12-12 LAB — URINE CULTURE: Colony Count: >=100000

## 2013-12-12 LAB — HEMOGLOBIN AND HEMATOCRIT, BLOOD
HEMATOCRIT: 33.8 % — AB (ref 36.0–46.0)
Hemoglobin: 11 g/dL — ABNORMAL LOW (ref 12.0–15.0)

## 2013-12-12 NOTE — Progress Notes (Signed)
Pharmacist Heart Failure Core Measure Documentation  Assessment: [name] [name] has an EF documented as 20% - 25% on 1/20 by Echo.  Rationale: Heart failure patients with left ventricular systolic dysfunction (LVSD) and an EF < 40% should be prescribed an angiotensin converting enzyme inhibitor (ACEI) or angiotensin receptor blocker (ARB) at discharge unless a contraindication is documented in the medical record.  This patient is not currently on an ACEI or ARB for HF.  She was on hydralazine and Isordil prior to admission.  This note is being placed in the record in order to provide documentation that a contraindication to the use of these agents is present for this encounter.  ACE Inhibitor or Angiotensin Receptor Blocker is contraindicated (specify all that apply)  []   ACEI allergy AND ARB allergy []   Angioedema []   Moderate or severe aortic stenosis []   Hyperkalemia []   Hypotension []   Renal artery stenosis []   Worsening renal function, preexisting renal disease or dysfunction   Lynann Beaver PharmD, BCPS Pager [phone] 12/12/2013 12:34 PM

## 2013-12-12 NOTE — Progress Notes (Signed)
[facility] PROGRESS NOTE  [name] [name] IDP:[id number] DOB: December 27, 1940 DOA: 12/09/2013 PCP: Doreatha Martin, MD  Assessment/Plan: Principal Problem:   UTI (lower urinary tract infection) - Currently growing Escherichia coli sensitive to Rocephin. Today being day 3 of 7. - With continued improvement more likely plan on discharging 12/13/2013.  Active Problems:   Hypertension - Continue to monitor blood pressures. With persistently elevated and pressures may consider adjusting antihypertensive medication. For now we'll plan on continuing current regimen of Lopressor 25 mg by mouth twice a day    Diabetes mellitus - Continue Lantus and sliding scale insulin -Continue carb modified diet    CKD (chronic kidney disease) stage 3, GFR 30-59 ml/min - Most likely combination of UTI and chronic daily NSAID use - Will recommend cessation of NSAIDs on discharge.  Code Status: Full Family Communication: No family at bedside  Disposition Plan: With continued improvement DC 12/13/2013   Consultants:  None  Procedures:  None  Antibiotics:  Rocephin  HPI/Subjective: Patient has no new complaints.   Objective: Filed Vitals:   12/12/13 1317  BP: 158/76  Pulse: 87  Temp: 98.1 F (36.7 C)  Resp: 16    Intake/Output Summary (Last 24 hours) at 12/12/13 1710 Last data filed at 12/12/13 1500  Gross per 24 hour  Intake   1750 ml  Output    975 ml  Net    775 ml   Filed Weights   12/11/13 0528 12/11/13 0539 12/12/13 0446  Weight: 68.8 kg (151 lb 10.8 oz) 68.4 kg (150 lb 12.7 oz) 69.446 kg (153 lb 1.6 oz)    Exam:   General:  Patient in no acute distress, alert and awake  Cardiovascular: Regular rate and rhythm, no murmur  Respiratory: Clear to auscultation bilaterally no wheezes  Abdomen: Soft, nontender nondistended  Musculoskeletal: No cyanosis or clubbing  Data Reviewed: Basic Metabolic Panel:  Recent Labs Lab 12/09/13 1630 12/10/13 0514  12/11/13 0540 12/12/13 0527  NA 140 138 139 135*  K 4.8 4.4 4.6 5.2  CL 102 102 102 100  CO2 25 24 22 21   GLUCOSE 151* 118* 94 214*  BUN 43* 40* 44* 40*  CREATININE 1.90* 1.98* 2.13* 1.75*  CALCIUM 8.9 8.6 9.3 8.9   Liver Function Tests:  Recent Labs Lab 12/10/13 0514  AST 21  ALT 15  ALKPHOS 73  BILITOT 0.5  PROT 6.6  ALBUMIN 2.6*    Recent Labs Lab 12/09/13 1630  LIPASE 42   No results found for this basename: AMMONIA,  in the last 168 hours CBC:  Recent Labs Lab 12/09/13 1630 12/10/13 0514 12/11/13 0540 12/12/13 0527  WBC 18.2* 12.6* 7.5  --   NEUTROABS 16.4*  --   --   --   HGB 12.2 10.8* 11.0* 11.0*  HCT 36.6 32.9* 33.3* 33.8*  MCV 79.7 81.0 80.4  --   PLT 281 263 289  --    Cardiac Enzymes: No results found for this basename: CKTOTAL, CKMB, CKMBINDEX, TROPONINI,  in the last 168 hours BNP (last 3 results)  Recent Labs  09/12/13 1035  PROBNP 4807.0*   CBG:  Recent Labs Lab 12/11/13 1703 12/11/13 2216 12/12/13 0722 12/12/13 1135 12/12/13 1652  GLUCAP 169* 194* 201* 223* 181*    Recent Results (from the past 240 hour(s))  URINE CULTURE     Status: None   Collection Time    12/09/13  6:28 PM      Result Value Range Status   Specimen Description URINE, CLEAN  CATCH   Final   Special Requests NONE   Final   Culture  Setup Time     Final   Value: 12/10/2013 07:52     [facility]formed at [facility]   Colony Count     Final   Value: >=100,000 COLONIES/ML     [facility]formed at [facility]   Culture     Final   Value: ESCHERICHIA COLI     [facility]formed at [facility]   Report Status 12/12/2013 FINAL   Final   Organism ID, Bacteria ESCHERICHIA COLI   Final     Studies: No results found.  Scheduled Meds: . acetaminophen  650 mg Oral Once  . amitriptyline  10 mg Oral QHS  . cefTRIAXone (ROCEPHIN)  IV  1 g Intravenous Q24H  . insulin aspart  0-15 Units Subcutaneous TID WC  . insulin aspart  0-5 Units Subcutaneous QHS  .  insulin glargine  20 Units Subcutaneous QHS  . metoprolol tartrate  25 mg Oral BID  . pantoprazole  40 mg Oral Daily  . sodium chloride  3 mL Intravenous Q12H   Continuous Infusions:   Principal Problem:   UTI (lower urinary tract infection) Active Problems:   Hypertension   Diabetes mellitus   Cardiomyopathy, (? ischemic AS AK on echo), EF 25%   CKD (chronic kidney disease) stage 3, GFR 30-59 ml/min    Time spent: > 35 minutes    Penny PiaVEGA, Briana Newman  [facility] Pager [phone] If 7PM-7AM, please contact night-coverage at [email or web address], password [email or web address] 12/12/2013, 5:10 PM  LOS: 3 days

## 2013-12-12 NOTE — Progress Notes (Signed)
Inpatient Diabetes Program Recommendations  AACE/ADA: New Consensus Statement on Inpatient Glycemic Control (2013)  Target Ranges:  Prepandial:   less than 140 mg/dL      Peak postprandial:   less than 180 mg/dL (1-2 hours)      Critically ill patients:  140 - 180 mg/dL   Reason for Visit: Results for [name] [name], [name] [name] (MRN [id number]) as of 12/12/2013 13:27  Ref. Range 12/12/2013 07:22 12/12/2013 11:35  Glucose-Capillary Latest Range: 70-99 mg/dL 782 (H) 423 (H)   Consider increasing Lantus to 28 units q HS.    Thanks,  Beryl Meager, RN, BC-ADM Inpatient Diabetes Coordinator Pager [phone]

## 2013-12-13 DIAGNOSIS — E860 Dehydration: Secondary | ICD-10-CM

## 2013-12-13 LAB — GLUCOSE, CAPILLARY
Glucose-Capillary: 180 mg/dL — ABNORMAL HIGH (ref 70–99)
Glucose-Capillary: 230 mg/dL — ABNORMAL HIGH (ref 70–99)

## 2013-12-13 MED ORDER — INSULIN GLARGINE 100 UNIT/ML ~~LOC~~ SOLN: 20.0000 [IU] | Freq: Every day | SUBCUTANEOUS | Status: AC

## 2013-12-13 MED ORDER — INSULIN GLARGINE 100 UNIT/ML ~~LOC~~ SOLN: 20.0000 [IU] | Freq: Every day | SUBCUTANEOUS | Status: DC

## 2013-12-13 MED ORDER — FUROSEMIDE 40 MG PO TABS: 40.0000 mg | ORAL_TABLET | Freq: Every day | ORAL | Status: AC

## 2013-12-13 MED ORDER — CEFDINIR 300 MG PO CAPS: 300.0000 mg | ORAL_CAPSULE | Freq: Two times a day (BID) | ORAL | Status: AC

## 2013-12-13 NOTE — Discharge Summary (Signed)
Physician Discharge Summary  [name] [name] XEN:[id number] DOB: Aug 31, 1941 DOA: 12/09/2013  PCP: Doreatha Martin, MD  Admit date: 12/09/2013 Discharge date: 12/13/2013  Time spent: > 35 minutes  Recommendations for Outpatient Follow-up:  1. Please reassess sodium and creatinine levels. 2. Do not recommend continuing NSAIDs  3. Assess and decide whether or not to prolong antibiotics for UTI 4. Decide when to continue Imdur as they were recently held secondary soft blood pressures 5. The patient to followup with urologist after discharge for further evaluation of bladder wall thickening.  Discharge Diagnoses:  Principal Problem:   UTI (lower urinary tract infection) Active Problems:   Hypertension   Diabetes mellitus   Cardiomyopathy, (? ischemic AS AK on echo), EF 25%   CKD (chronic kidney disease) stage 3, GFR 30-59 ml/min   Discharge Condition: Stable  Diet recommendation: Carb modified, heart healthy  Filed Weights   12/11/13 0539 12/12/13 0446 12/13/13 0538  Weight: 68.4 kg (150 lb 12.7 oz) 69.446 kg (153 lb 1.6 oz) 70.8 kg (156 lb 1.4 oz)    History of present illness:  [name] with past medical history of hypertension, nonischemic cardiomyopathy status post ICD implantation, systolic congestive heart failure, and history of diabetes mellitus. Presented to the ED complaining of vomiting and chills. Patient was found to have UTI and elevated serum creatinine in the ED  Hospital Course:   Principal Problem:  UTI (lower urinary tract infection)  - Currently growing Escherichia coli sensitive to Rocephin. Has had 4 days of IV antibiotic treatment. We'll plan on treating as complicated UTI given abdominal/pelvis CT findings -We'll plan on discharging on third generation oral cephalosporin Omnicef for 3 more days to complete a seven-day total regimen -The patient to followup with urologist after discharge for further evaluation of bladder wall thickening  with Dr. Lowella Petties.  Active Problems:  Hypertension  - Continue home regimen given history of systolic heart failure. We'll hold Imdur on discharge -Avoid nonsteroidal anti-inflammatories  Diabetes mellitus  - Patient to continue home regimen but will discharge on decrease Lantus dose.  CKD (chronic kidney disease) stage 3, GFR 30-59 ml/min  - Most likely combination of UTI and chronic daily NSAID use  - Will recommend cessation of NSAIDs on discharge.  - Trending down and expect value to continue to improve.    systolic heart failure - Compensated currently. - Will hold Lasix for a few days and continue over the weekend given that patient has systolic heart failure and 20-25%   Procedures:  CT abdomen and pelvis as listed below.  Consultations:  Urology  Discharge Exam: Filed Vitals:   12/13/13 0538  BP: 134/61  Pulse: 80  Temp: 97.3 F (36.3 C)  Resp: 16    General: Pt in nAD, alert and awake Cardiovascular: RRR, no MRG Respiratory: CTA BL, no wheezes, no increased WOB  Discharge Instructions  Discharge Orders   Future Appointments Provider Department Dept Phone   12/25/2013 10:00 AM Thurmon Fair, MD [facility] [phone]   Future Orders Complete By Expires   (HEART FAILURE PATIENTS) Call MD:  Anytime you have any of the following symptoms: 1) 3 pound weight gain in 24 hours or 5 pounds in 1 week 2) shortness of breath, with or without a dry hacking cough 3) swelling in the hands, feet or stomach 4) if you have to sleep on extra pillows at night in order to breathe.  As directed    Call MD for:  difficulty breathing, headache or visual  disturbances  As directed    Call MD for:  temperature >100.4  As directed    Diet - low sodium heart healthy  As directed    Discharge instructions  As directed    Comments:     Please followup with your primary care physician or cardiologist within the next one to 2 weeks or sooner should any new concerns  arise.   Increase activity slowly  As directed        Medication List    STOP taking these medications       furosemide 40 MG tablet  Commonly known as:  LASIX     isosorbide dinitrate 10 MG tablet  Commonly known as:  ISORDIL     naproxen sodium 220 MG tablet  Commonly known as:  ANAPROX      TAKE these medications       albuterol 108 (90 BASE) MCG/ACT inhaler  Commonly known as:  PROVENTIL HFA;VENTOLIN HFA  Inhale 2 puffs into the lungs every 6 (six) hours as needed. For wheezing     amitriptyline 10 MG tablet  Commonly known as:  ELAVIL  Take 10 mg by mouth at bedtime.     cefdinir 300 MG capsule  Commonly known as:  OMNICEF  Take 1 capsule (300 mg total) by mouth 2 (two) times daily.     hydrALAZINE 25 MG tablet  Commonly known as:  APRESOLINE  Take 2 tablets (50 mg total) by mouth 3 (three) times daily.     insulin glargine 100 UNIT/ML injection  Commonly known as:  LANTUS  Inject 0.2 mLs (20 Units total) into the skin at bedtime. Take 30 units in the morning or 35 units in the evening     metoprolol tartrate 25 MG tablet  Commonly known as:  LOPRESSOR  Take 25 mg by mouth 2 (two) times daily.     sitaGLIPtin 50 MG tablet  Commonly known as:  JANUVIA  Take 0.5 tablets (25 mg total) by mouth daily.     spironolactone 25 MG tablet  Commonly known as:  ALDACTONE  Take 0.5 tablets (12.5 mg total) by mouth daily.       Allergies  Allergen Reactions  . Codeine Nausea Only  . Statins Diarrhea       Follow-up Information   Follow up with Mena GoesEskridge, Lowella PettiesMatthew Ramsey, MD. (2-3 weeks)    Specialty:  Urology   Contact information:   [address] [facility]  [place] [address] [phone]        The results of significant diagnostics from this hospitalization (including imaging, microbiology, ancillary and laboratory) are listed below for reference.    Significant Diagnostic Studies: Ct Abdomen Pelvis Wo  Contrast  12/10/2013   CLINICAL DATA:  Hematuria.  EXAM: CT ABDOMEN AND PELVIS WITHOUT CONTRAST  TECHNIQUE: Multidetector CT imaging of the abdomen and pelvis was performed following the standard protocol without intravenous contrast.  COMPARISON:  None.  FINDINGS: Liver normal. Spleen normal. Pancreas normal. Gallstones. No gallbladder wall thickening or pericholecystic fluid collections. No biliary distention.  Adrenals normal. A 5 mm calcific density noted in the right renal hilum. This appears to represent renal vascular calcification. Innumerable pelvic phleboliths. Tiny nonobstructing distal ureteral stones difficult to completely exclude due to the multiple phleboliths present. Bladder wall is thickened measuring 13 mm. Cystitis and/or bladder malignancy could present in this fashion. There is air noted in the bladder. This could be from instrumentation, infection, or fistula. Question  has the patient had a recent bladder catheterization. Uterus and adnexa are unremarkable. No free pelvic fluid.  No significant adenopathy. Aorta normal caliber. Atherosclerotic vascular calcification.  Appendix normal. No inflammatory changes node in the right or left lower quadrant. No bowel distention. Small hiatal hernia.  Abdominal wall is intact. Degenerative changes thoracolumbar spine and both hips. Mild atelectasis lung bases. Cardiomegaly. Cardiac pacer noted.  IMPRESSION: 1. Thickened bladder wall. This could be seen with cystitis and/or bladder malignancy. Air is noted within the bladder. This could be secondary to instrumentation, infection, or fistula. Question has the patient had recent catheterization. Cystoscopic evaluation may prove useful. 2. Calcification noted project to the right renal hilum. This appears to be in the right renal artery. No evidence of obstructing ureteral stone.   Electronically Signed   By: Maisie Fus  Register   On: 12/10/2013 08:04   Dg Chest 2 View  12/09/2013   CLINICAL DATA:  Emesis   EXAM: CHEST  2 VIEW  COMPARISON:  DG CHEST 1V PORT dated 10/12/2013  FINDINGS: Left-sided pacer with leads overlying normal cardiac silhouette. There is no pulmonary edema or pleural fluid. No pneumothorax. There is mild central venous pulmonary congestion.  IMPRESSION: Mild central venous pulmonary congestion. No pulmonary edema or infiltrate.   Electronically Signed   By: Genevive Bi M.D.   On: 12/09/2013 17:01    Microbiology: Recent Results (from the past 240 hour(s))  URINE CULTURE     Status: None   Collection Time    12/09/13  6:28 PM      Result Value Range Status   Specimen Description URINE, CLEAN CATCH   Final   Special Requests NONE   Final   Culture  Setup Time     Final   Value: 12/10/2013 07:52     Performed at [facility] Count     Final   Value: >=100,000 COLONIES/ML     Performed at [facility]   Culture     Final   Value: ESCHERICHIA COLI     Performed at [facility]   Report Status 12/12/2013 FINAL   Final   Organism ID, Bacteria ESCHERICHIA COLI   Final     Labs: Basic Metabolic Panel:  Recent Labs Lab 12/09/13 1630 12/10/13 0514 12/11/13 0540 12/12/13 0527  NA 140 138 139 135*  K 4.8 4.4 4.6 5.2  CL 102 102 102 100  CO2 25 24 22 21   GLUCOSE 151* 118* 94 214*  BUN 43* 40* 44* 40*  CREATININE 1.90* 1.98* 2.13* 1.75*  CALCIUM 8.9 8.6 9.3 8.9   Liver Function Tests:  Recent Labs Lab 12/10/13 0514  AST 21  ALT 15  ALKPHOS 73  BILITOT 0.5  PROT 6.6  ALBUMIN 2.6*    Recent Labs Lab 12/09/13 1630  LIPASE 42   No results found for this basename: AMMONIA,  in the last 168 hours CBC:  Recent Labs Lab 12/09/13 1630 12/10/13 0514 12/11/13 0540 12/12/13 0527  WBC 18.2* 12.6* 7.5  --   NEUTROABS 16.4*  --   --   --   HGB 12.2 10.8* 11.0* 11.0*  HCT 36.6 32.9* 33.3* 33.8*  MCV 79.7 81.0 80.4  --   PLT 281 263 289  --    Cardiac Enzymes: No results found for this basename: CKTOTAL, CKMB,  CKMBINDEX, TROPONINI,  in the last 168 hours BNP: BNP (last 3 results)  Recent Labs  09/12/13 1035  PROBNP 4807.0*  CBG:  Recent Labs Lab 12/12/13 0722 12/12/13 1135 12/12/13 1652 12/12/13 2215 12/13/13 0716  GLUCAP 201* 223* 181* 199* 180*       Signed:  Penny Pia  [facility] 12/13/2013, 11:04 AM

## 2013-12-13 NOTE — Progress Notes (Signed)
Discharge instructions reviewed with pt's daughter, per pt request, because the daughter handles the pt's medications and doctor appointments.

## 2013-12-14 ENCOUNTER — Telehealth: Payer: Self-pay | Admitting: *Deleted

## 2013-12-14 NOTE — Telephone Encounter (Signed)
Pt's daughter called in regards to her mother being in the hospital and the doctor taking her off of the isosorbide. She is retaining water and she has SOB. She said that she weighs more now than when she went into the hospital. She would like a call back to let her know what to do.  MC

## 2013-12-14 NOTE — Telephone Encounter (Signed)
Daughter called back and informed per instructions by PA.  Verbalized understanding and agreed w/ plan.  Stated she will have to get a BP monitor.  RN advised NO isosorbide until able to check BP.  Verbalized understanding.

## 2013-12-14 NOTE — Telephone Encounter (Signed)
Daughter called again-she says she know you are busy,but would like to find out something asap please.

## 2013-12-14 NOTE — Telephone Encounter (Signed)
Returned call and pt verified x 2 w/ Suzette Battiest, pt's daughter.  Stated pt was discharged from the hospital yesterday and isosorbide, naproxen and Lasix were stopped.  Stated pt is tired, SOB and has difficulty walking.  Stated she has been set back from how she was before she was in the hospital.  Denied pt c/o CP and SOB now.  Stated pt's breathing was "funny" last night when she was sleeping.  Daughter wants to know if they were justified in stopping isosorbide or not.    RN informed daughter that isosorbide is for chest discomfort and if pt is not having any problems, then she can continue with plan to stop it.  Also informed naproxen and Lasix were stopped r/ there kidney function.  Daughter stated she understands that, but pt has been having a hard time w/ pain b/c she can't take naproxen.  Stated she was given Tylenol in the hospital and it made her sick.  Stated they were giving her tramadol and it seemed to help.  RN asked who had been prescribing naproxen and daughter stated pt was taking OTC naproxen, but PCP knew about it.  Stated she really wants to make sure pt is doing the right thing w/ stopping the meds.  Also stated she was told to hold Lasix until the weekend.  Pt's weight was 157 when she got home yesterday.  RN advised daily weights when waking up in the AM after urination.  Daughter verbalized understanding and stated she will start checking in the AM.  Pt did have echo while in the hospital since she missed her appt while there.    Daughter informed a provider will be notified to review chart for further instructions, if needed, as Dr. Royann Shivers is out of the office.  Verbalized understanding.  Message forwarded to B. Leron Croak, PA-C for further instructions.  Pt scheduled to see Dr. Royann Shivers on 2.3.15.  1) Is pt on correct medication regimen given recent hospitalization? 2) Should pain management be deferred to PCP? (pt not currently having CP/pressure)

## 2013-12-14 NOTE — Telephone Encounter (Signed)
Returned call.  Left message to call back before 4pm.  

## 2013-12-14 NOTE — Telephone Encounter (Signed)
1.  The patient was to hold lasix for a few days and then restart over the weekend.  Monitoring weight is good.  I recommend she continue to hold the lasix until she has gained 2 pounds.  Then restart at previous home dose.  No excess drinking of fluids.   2.  Family Medicine ask her to stop the Naproxen as it is likely contributing to her kidney disease.  PCP can prescribe alternate pain meds.  3.  Isosorbide was stopped due to hypotension.  If BP is being checked at home, and is >110/60, restart at 10mg  tid.  Arvine Clayburn 1:58 PM

## 2013-12-23 DEATH — deceased

## 2013-12-25 ENCOUNTER — Encounter: Payer: Medicare Other | Admitting: Cardiovascular Disease

## 2014-02-20 DEATH — deceased

## 2014-02-26 ENCOUNTER — Telehealth: Payer: Self-pay | Admitting: Cardiovascular Disease

## 2014-03-14 NOTE — Telephone Encounter (Signed)
Closed encounter °

## 2014-05-29 ENCOUNTER — Encounter: Payer: Self-pay | Admitting: *Deleted

## 2014-12-24 ENCOUNTER — Telehealth: Payer: Self-pay | Admitting: Cardiovascular Disease

## 2014-12-24 NOTE — Telephone Encounter (Signed)
2/2 left vm to sched pacer w/ Croitoru; ah

## 2015-09-05 IMAGING — CT CT ABD-PELV W/O CM
2 of 3 series · 17 of 46 positions shown, 19 images · non-contrast
Comparison: None.

CLINICAL DATA: Hematuria.

EXAM:
CT ABDOMEN AND PELVIS WITHOUT CONTRAST
TECHNIQUE: Multidetector CT imaging of the abdomen and pelvis was performed
following the standard protocol without intravenous contrast.

[Series 2: under 200# stone no prev · axial · 0.77mm/px · z∈[+790,+1194]mm · 14 of 93 slices shown, 16 images]
[im 6/93  soft-tissue]
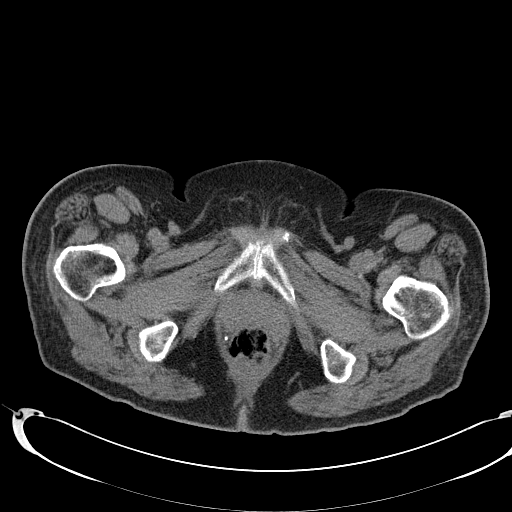
[im 6/93  bone]
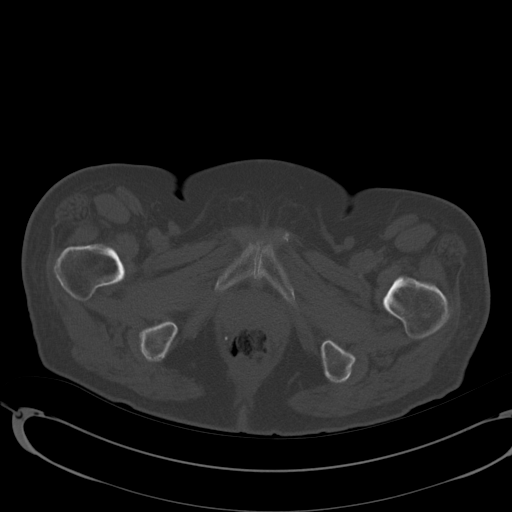
[im 12/93  soft-tissue]
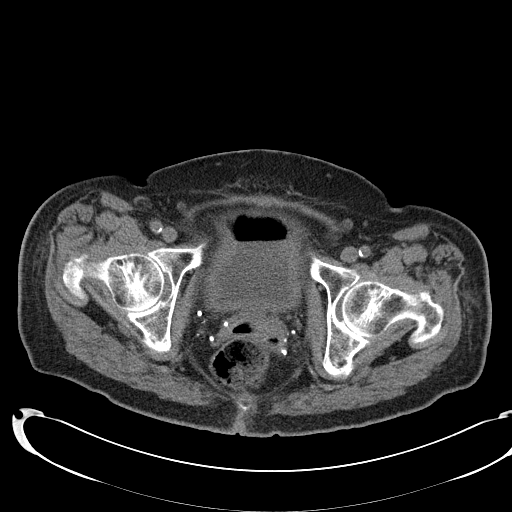
[im 18/93  soft-tissue]
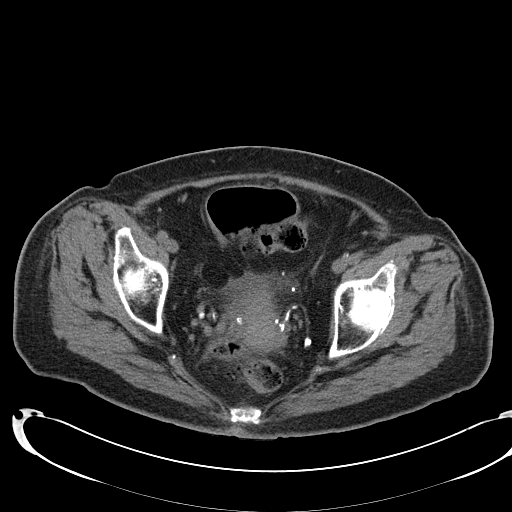
[im 24/93  soft-tissue]
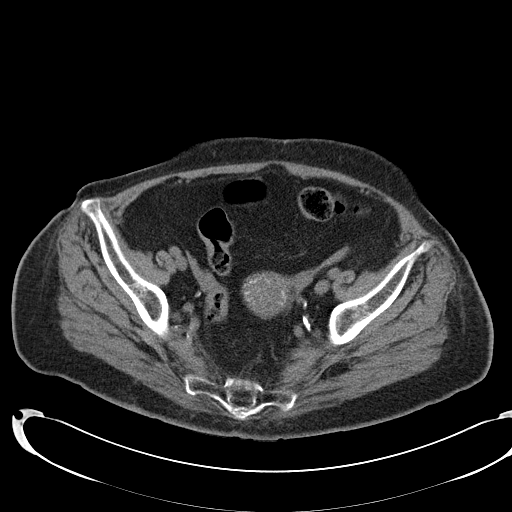
[im 30/93  soft-tissue]
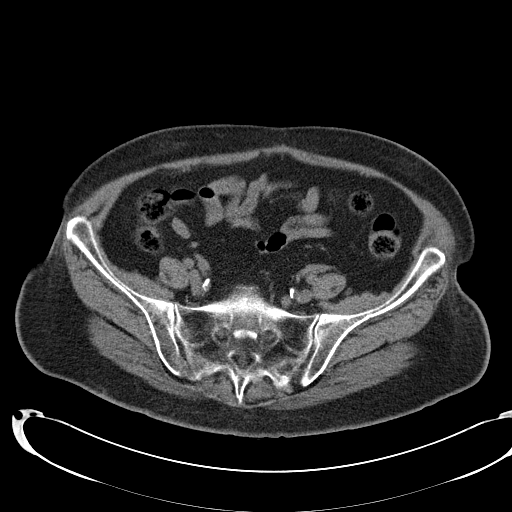
[im 36/93  soft-tissue]
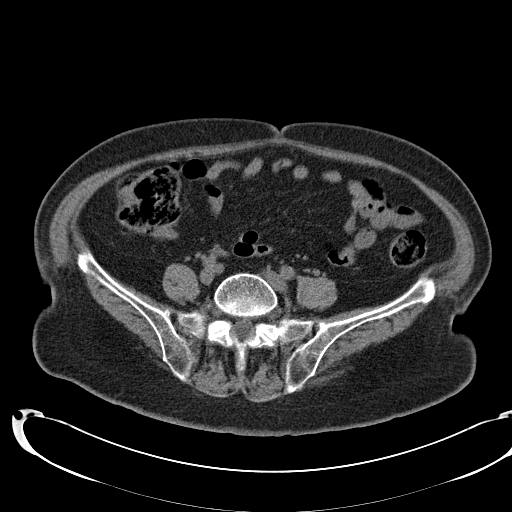
[im 42/93  soft-tissue]
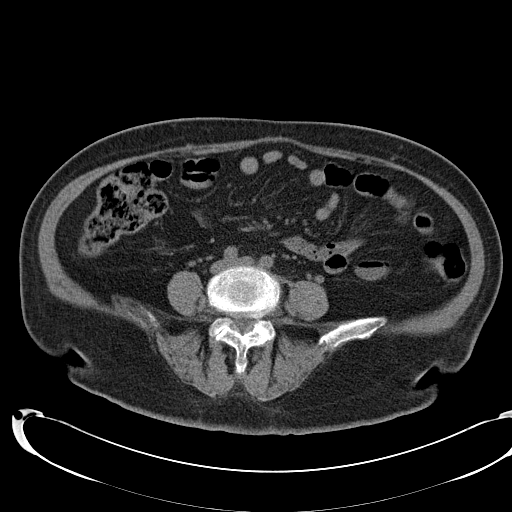
[im 51/93  soft-tissue]
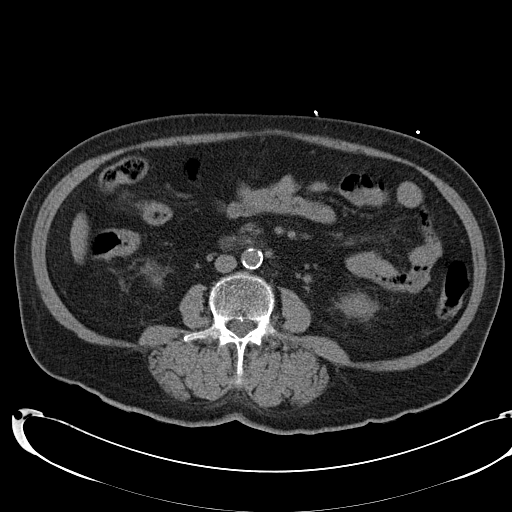
[im 57/93  soft-tissue]
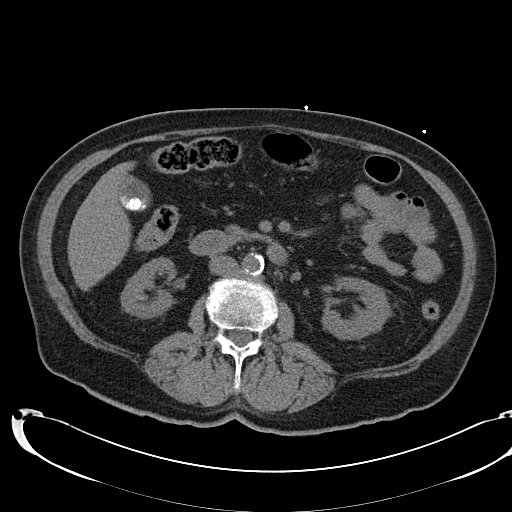
[im 57/93  bone]
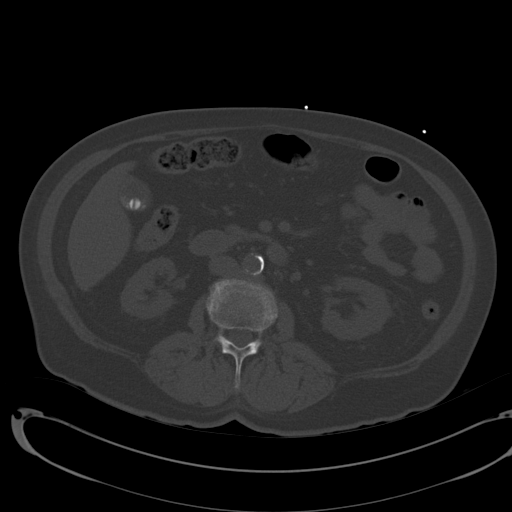
[im 63/93  soft-tissue]
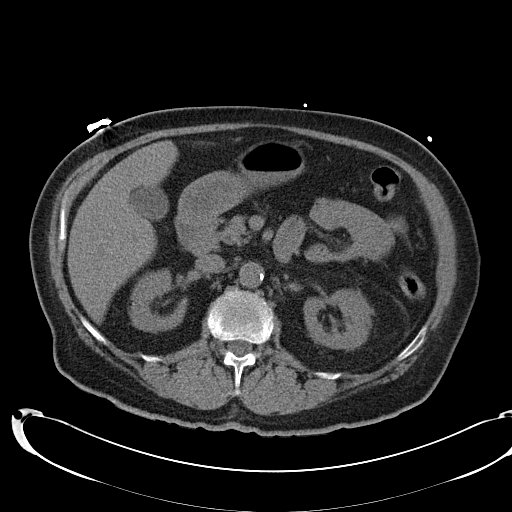
[im 69/93  soft-tissue]
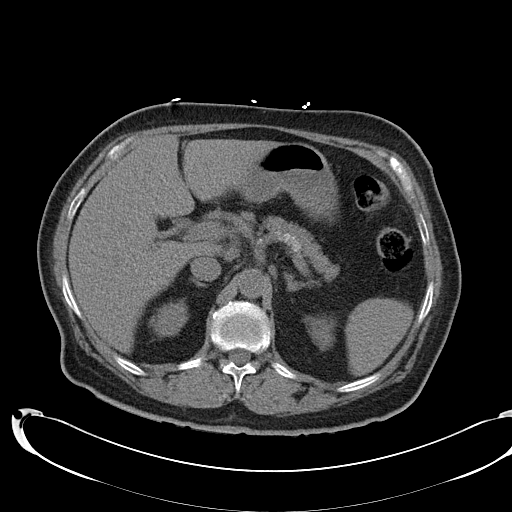
[im 75/93  soft-tissue]
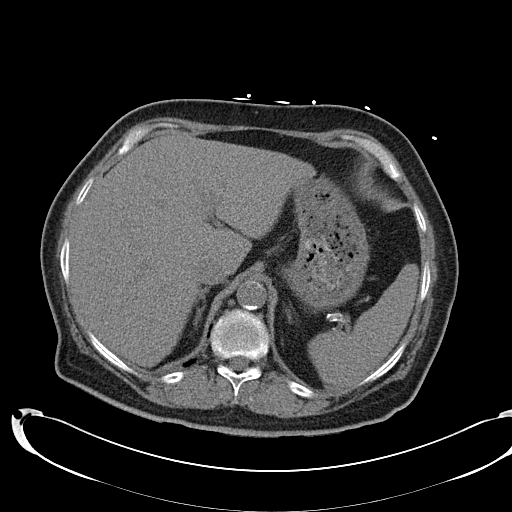
[im 81/93  soft-tissue]
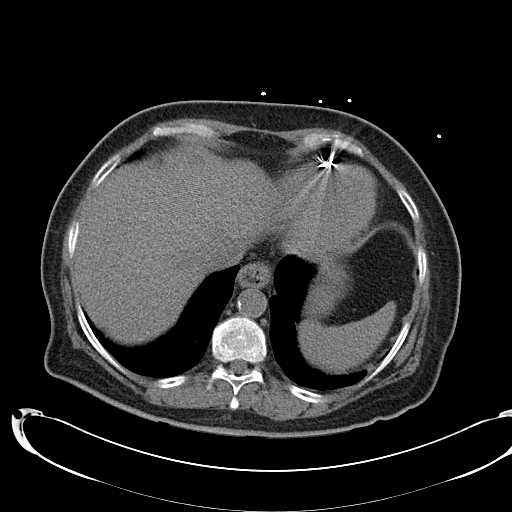
[im 87/93  soft-tissue]
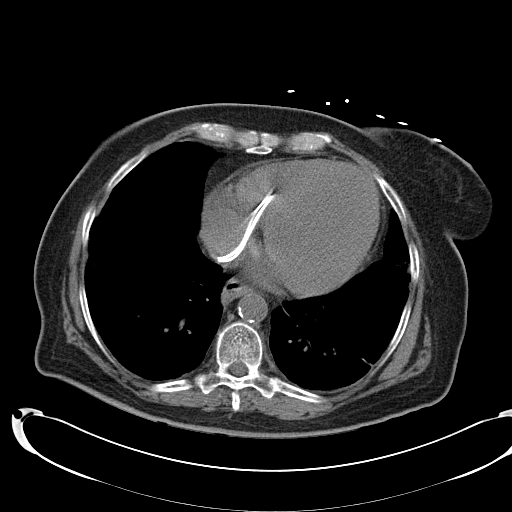

[Series 602: <mpr thick range> · coronal · 0.91mm/px · 3 of 93 slices shown]
[im 31/93  soft-tissue]
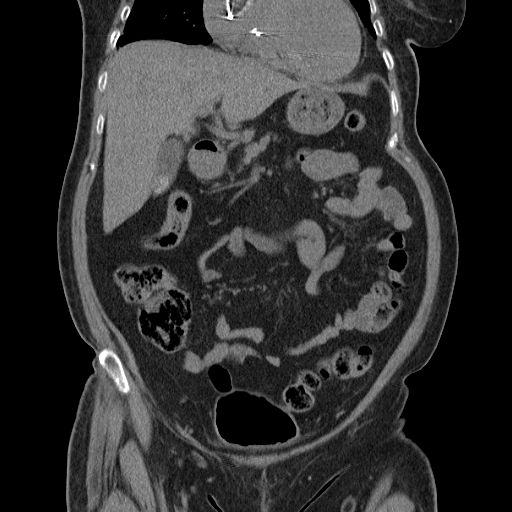
[im 41/93  soft-tissue]
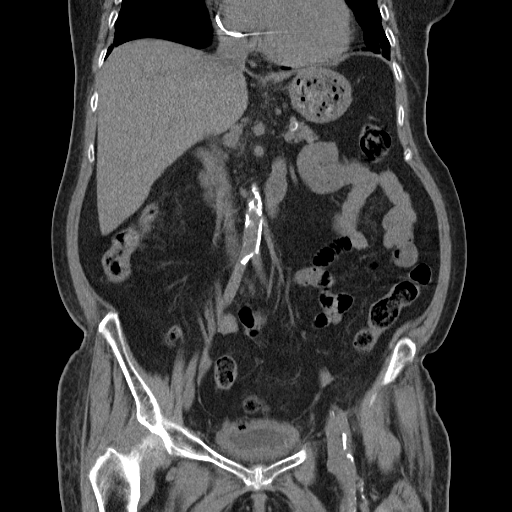
[im 52/93  soft-tissue]
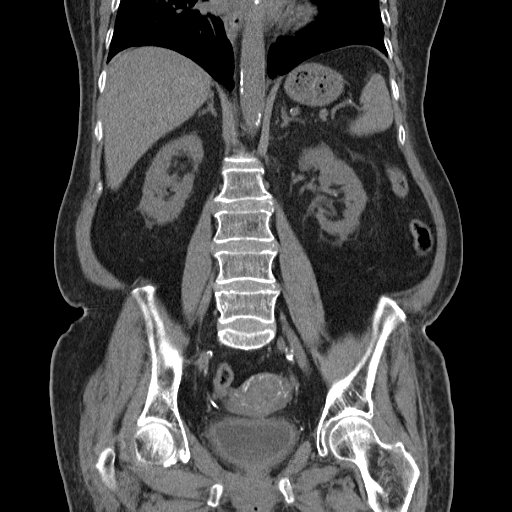

[17 of 46 positions shown; findings below may reference images not displayed]

FINDINGS: Liver normal. Spleen normal. Pancreas normal. Gallstones. No
gallbladder wall thickening or pericholecystic fluid collections. No
biliary distention.

Adrenals normal. A 5 mm calcific density noted in the right renal
hilum. This appears to represent renal vascular calcification.
Innumerable pelvic phleboliths. Tiny nonobstructing distal ureteral
stones difficult to completely exclude due to the multiple
phleboliths present. Bladder wall is thickened measuring 13 mm.
Cystitis and/or bladder malignancy could present in this fashion.
There is air noted in the bladder. This could be from
instrumentation, infection, or fistula. Question has the patient had
a recent bladder catheterization. Uterus and adnexa are
unremarkable. No free pelvic fluid.

No significant adenopathy. Aorta normal caliber. Atherosclerotic
vascular calcification.

Appendix normal. No inflammatory changes node in the right or left
lower quadrant. No bowel distention. Small hiatal hernia.

Abdominal wall is intact. Degenerative changes thoracolumbar spine
and both hips. Mild atelectasis lung bases. Cardiomegaly. Cardiac
pacer noted.
IMPRESSION: 1. Thickened bladder wall. This could be seen with cystitis and/or
bladder malignancy. Air is noted within the bladder. This could be
secondary to instrumentation, infection, or fistula. Question has
the patient had recent catheterization. Cystoscopic evaluation may
prove useful.
2. Calcification noted project to the right renal hilum. This
appears to be in the right renal artery. No evidence of obstructing
ureteral stone.
